# Patient Record
Sex: Female | Born: 1945 | Hispanic: No | Marital: Single | Smoking: Former smoker
Health system: Southern US, Community
[De-identification: ages and names within clinical notes are randomized; demographics above are authoritative.]

## PROBLEM LIST (undated history)

## (undated) DIAGNOSIS — I509 Heart failure, unspecified: Secondary | ICD-10-CM

## (undated) DIAGNOSIS — N183 Chronic kidney disease, stage 3 unspecified: Secondary | ICD-10-CM

## (undated) DIAGNOSIS — E039 Hypothyroidism, unspecified: Secondary | ICD-10-CM

## (undated) DIAGNOSIS — K219 Gastro-esophageal reflux disease without esophagitis: Secondary | ICD-10-CM

## (undated) DIAGNOSIS — I2699 Other pulmonary embolism without acute cor pulmonale: Secondary | ICD-10-CM

## (undated) DIAGNOSIS — C159 Malignant neoplasm of esophagus, unspecified: Secondary | ICD-10-CM

## (undated) DIAGNOSIS — I2510 Atherosclerotic heart disease of native coronary artery without angina pectoris: Secondary | ICD-10-CM

## (undated) DIAGNOSIS — I10 Essential (primary) hypertension: Secondary | ICD-10-CM

## (undated) DIAGNOSIS — J449 Chronic obstructive pulmonary disease, unspecified: Secondary | ICD-10-CM

## (undated) DIAGNOSIS — E785 Hyperlipidemia, unspecified: Secondary | ICD-10-CM

## (undated) DIAGNOSIS — M1990 Unspecified osteoarthritis, unspecified site: Secondary | ICD-10-CM

## (undated) DIAGNOSIS — I4891 Unspecified atrial fibrillation: Secondary | ICD-10-CM

## (undated) HISTORY — PX: EYE SURGERY: SHX253

## (undated) HISTORY — PX: UPPER GI ENDOSCOPY: SHX6162

## (undated) HISTORY — PX: OTHER SURGICAL HISTORY: SHX169

## (undated) HISTORY — PX: CORONARY ARTERY BYPASS GRAFT: SHX141

---

## 2015-09-14 ENCOUNTER — Inpatient Hospital Stay (HOSPITAL_COMMUNITY)
Admission: AD | Admit: 2015-09-14 | Discharge: 2015-09-21 | DRG: 871 | Disposition: A | Discharge status: Home / Self Care | Payer: Medicare (Managed Care) | Source: Other Acute Inpatient Hospital | Attending: Internal Medicine | Admitting: Internal Medicine

## 2015-09-14 ENCOUNTER — Encounter (HOSPITAL_COMMUNITY): Payer: Self-pay | Admitting: Internal Medicine

## 2015-09-14 DIAGNOSIS — I509 Heart failure, unspecified: Secondary | ICD-10-CM | POA: Diagnosis not present

## 2015-09-14 DIAGNOSIS — C159 Malignant neoplasm of esophagus, unspecified: Secondary | ICD-10-CM | POA: Diagnosis present

## 2015-09-14 DIAGNOSIS — Z7982 Long term (current) use of aspirin: Secondary | ICD-10-CM | POA: Diagnosis not present

## 2015-09-14 DIAGNOSIS — Z86711 Personal history of pulmonary embolism: Secondary | ICD-10-CM

## 2015-09-14 DIAGNOSIS — J189 Pneumonia, unspecified organism: Secondary | ICD-10-CM | POA: Diagnosis present

## 2015-09-14 DIAGNOSIS — I25810 Atherosclerosis of coronary artery bypass graft(s) without angina pectoris: Secondary | ICD-10-CM | POA: Diagnosis present

## 2015-09-14 DIAGNOSIS — K219 Gastro-esophageal reflux disease without esophagitis: Secondary | ICD-10-CM | POA: Diagnosis present

## 2015-09-14 DIAGNOSIS — Z8249 Family history of ischemic heart disease and other diseases of the circulatory system: Secondary | ICD-10-CM | POA: Diagnosis not present

## 2015-09-14 DIAGNOSIS — J9602 Acute respiratory failure with hypercapnia: Secondary | ICD-10-CM

## 2015-09-14 DIAGNOSIS — Z87891 Personal history of nicotine dependence: Secondary | ICD-10-CM | POA: Diagnosis not present

## 2015-09-14 DIAGNOSIS — J9601 Acute respiratory failure with hypoxia: Secondary | ICD-10-CM

## 2015-09-14 DIAGNOSIS — I5033 Acute on chronic diastolic (congestive) heart failure: Secondary | ICD-10-CM | POA: Diagnosis not present

## 2015-09-14 DIAGNOSIS — J9600 Acute respiratory failure, unspecified whether with hypoxia or hypercapnia: Secondary | ICD-10-CM | POA: Diagnosis present

## 2015-09-14 DIAGNOSIS — J441 Chronic obstructive pulmonary disease with (acute) exacerbation: Secondary | ICD-10-CM | POA: Diagnosis present

## 2015-09-14 DIAGNOSIS — E785 Hyperlipidemia, unspecified: Secondary | ICD-10-CM | POA: Diagnosis present

## 2015-09-14 DIAGNOSIS — I2510 Atherosclerotic heart disease of native coronary artery without angina pectoris: Secondary | ICD-10-CM | POA: Diagnosis present

## 2015-09-14 DIAGNOSIS — N183 Chronic kidney disease, stage 3 unspecified: Secondary | ICD-10-CM | POA: Diagnosis present

## 2015-09-14 DIAGNOSIS — I5022 Chronic systolic (congestive) heart failure: Secondary | ICD-10-CM | POA: Diagnosis present

## 2015-09-14 DIAGNOSIS — Z955 Presence of coronary angioplasty implant and graft: Secondary | ICD-10-CM

## 2015-09-14 DIAGNOSIS — Z888 Allergy status to other drugs, medicaments and biological substances status: Secondary | ICD-10-CM

## 2015-09-14 DIAGNOSIS — R0602 Shortness of breath: Secondary | ICD-10-CM | POA: Diagnosis present

## 2015-09-14 DIAGNOSIS — Y95 Nosocomial condition: Secondary | ICD-10-CM | POA: Diagnosis present

## 2015-09-14 DIAGNOSIS — I4891 Unspecified atrial fibrillation: Secondary | ICD-10-CM | POA: Diagnosis present

## 2015-09-14 DIAGNOSIS — I272 Other secondary pulmonary hypertension: Secondary | ICD-10-CM | POA: Diagnosis present

## 2015-09-14 DIAGNOSIS — N179 Acute kidney failure, unspecified: Secondary | ICD-10-CM | POA: Diagnosis present

## 2015-09-14 DIAGNOSIS — Z9981 Dependence on supplemental oxygen: Secondary | ICD-10-CM

## 2015-09-14 DIAGNOSIS — I10 Essential (primary) hypertension: Secondary | ICD-10-CM | POA: Diagnosis present

## 2015-09-14 DIAGNOSIS — N289 Disorder of kidney and ureter, unspecified: Secondary | ICD-10-CM | POA: Diagnosis not present

## 2015-09-14 DIAGNOSIS — Z79891 Long term (current) use of opiate analgesic: Secondary | ICD-10-CM

## 2015-09-14 DIAGNOSIS — I482 Chronic atrial fibrillation, unspecified: Secondary | ICD-10-CM | POA: Diagnosis present

## 2015-09-14 DIAGNOSIS — J9621 Acute and chronic respiratory failure with hypoxia: Secondary | ICD-10-CM | POA: Diagnosis present

## 2015-09-14 DIAGNOSIS — A419 Sepsis, unspecified organism: Secondary | ICD-10-CM | POA: Diagnosis present

## 2015-09-14 DIAGNOSIS — E039 Hypothyroidism, unspecified: Secondary | ICD-10-CM | POA: Diagnosis present

## 2015-09-14 DIAGNOSIS — F419 Anxiety disorder, unspecified: Secondary | ICD-10-CM | POA: Diagnosis present

## 2015-09-14 DIAGNOSIS — Z79899 Other long term (current) drug therapy: Secondary | ICD-10-CM

## 2015-09-14 DIAGNOSIS — I481 Persistent atrial fibrillation: Secondary | ICD-10-CM | POA: Diagnosis not present

## 2015-09-14 DIAGNOSIS — Z8501 Personal history of malignant neoplasm of esophagus: Secondary | ICD-10-CM

## 2015-09-14 DIAGNOSIS — J9620 Acute and chronic respiratory failure, unspecified whether with hypoxia or hypercapnia: Secondary | ICD-10-CM | POA: Diagnosis present

## 2015-09-14 DIAGNOSIS — Z951 Presence of aortocoronary bypass graft: Secondary | ICD-10-CM | POA: Diagnosis present

## 2015-09-14 DIAGNOSIS — G4733 Obstructive sleep apnea (adult) (pediatric): Secondary | ICD-10-CM | POA: Diagnosis present

## 2015-09-14 DIAGNOSIS — J9622 Acute and chronic respiratory failure with hypercapnia: Secondary | ICD-10-CM | POA: Diagnosis present

## 2015-09-14 HISTORY — DX: Hypothyroidism, unspecified: E03.9

## 2015-09-14 HISTORY — DX: Heart failure, unspecified: I50.9

## 2015-09-14 HISTORY — DX: Gastro-esophageal reflux disease without esophagitis: K21.9

## 2015-09-14 HISTORY — DX: Atherosclerotic heart disease of native coronary artery without angina pectoris: I25.10

## 2015-09-14 HISTORY — DX: Other pulmonary embolism without acute cor pulmonale: I26.99

## 2015-09-14 HISTORY — DX: Chronic kidney disease, stage 3 (moderate): N18.3

## 2015-09-14 HISTORY — DX: Chronic kidney disease, stage 3 unspecified: N18.30

## 2015-09-14 HISTORY — DX: Hyperlipidemia, unspecified: E78.5

## 2015-09-14 HISTORY — DX: Unspecified osteoarthritis, unspecified site: M19.90

## 2015-09-14 HISTORY — DX: Unspecified atrial fibrillation: I48.91

## 2015-09-14 HISTORY — DX: Essential (primary) hypertension: I10

## 2015-09-14 HISTORY — DX: Malignant neoplasm of esophagus, unspecified: C15.9

## 2015-09-14 HISTORY — DX: Chronic obstructive pulmonary disease, unspecified: J44.9

## 2015-09-14 LAB — TROPONIN I: TROPONIN I: 0.04 ng/mL — AB (ref <0.031)

## 2015-09-14 LAB — MRSA PCR SCREENING: MRSA by PCR: NEGATIVE

## 2015-09-14 LAB — BLOOD GAS, ARTERIAL
Acid-Base Excess: 7.4 mmol/L — ABNORMAL HIGH (ref 0.0–2.0)
BICARBONATE: 32.3 meq/L — AB (ref 20.0–24.0)
Delivery systems: POSITIVE
Drawn by: 24487
Expiratory PAP: 6
FIO2: 0.4
Inspiratory PAP: 12
O2 SAT: 97.8 %
PATIENT TEMPERATURE: 97.9
PO2 ART: 106 mmHg — AB (ref 80.0–100.0)
TCO2: 33.9 mmol/L (ref 0–100)
pCO2 arterial: 52.7 mmHg — ABNORMAL HIGH (ref 35.0–45.0)
pH, Arterial: 7.403 (ref 7.350–7.450)

## 2015-09-14 MED ORDER — LORAZEPAM 2 MG/ML IJ SOLN
0.5000 mg | INTRAMUSCULAR | Status: DC | PRN
  Administered 2015-09-15: 0.5 mg via INTRAVENOUS
  Filled 2015-09-14: qty 1

## 2015-09-14 MED ORDER — IPRATROPIUM-ALBUTEROL 0.5-2.5 (3) MG/3ML IN SOLN
3.0000 mL | Freq: Four times a day (QID) | RESPIRATORY_TRACT | Status: DC
  Administered 2015-09-15 – 2015-09-16 (×6): 3 mL via RESPIRATORY_TRACT
  Filled 2015-09-14 (×6): qty 3

## 2015-09-14 MED ORDER — METHYLPREDNISOLONE SODIUM SUCC 125 MG IJ SOLR
60.0000 mg | Freq: Every day | INTRAMUSCULAR | Status: DC
  Administered 2015-09-15 (×2): 60 mg via INTRAVENOUS
  Filled 2015-09-14 (×3): qty 2

## 2015-09-14 MED ORDER — NITROGLYCERIN 0.4 MG SL SUBL
0.4000 mg | SUBLINGUAL_TABLET | SUBLINGUAL | Status: DC | PRN
  Administered 2015-09-17: 0.4 mg via SUBLINGUAL
  Filled 2015-09-14: qty 1

## 2015-09-14 MED ORDER — LEVOFLOXACIN IN D5W 500 MG/100ML IV SOLN
500.0000 mg | INTRAVENOUS | Status: DC
  Administered 2015-09-15 – 2015-09-16 (×2): 500 mg via INTRAVENOUS
  Filled 2015-09-14 (×3): qty 100

## 2015-09-14 MED ORDER — DABIGATRAN ETEXILATE MESYLATE 150 MG PO CAPS
150.0000 mg | ORAL_CAPSULE | Freq: Two times a day (BID) | ORAL | Status: DC
  Administered 2015-09-14 – 2015-09-21 (×14): 150 mg via ORAL
  Filled 2015-09-14 (×14): qty 1

## 2015-09-14 MED ORDER — VANCOMYCIN HCL IN DEXTROSE 1-5 GM/200ML-% IV SOLN
1000.0000 mg | INTRAVENOUS | Status: DC
  Administered 2015-09-15: 1000 mg via INTRAVENOUS
  Filled 2015-09-14 (×2): qty 200

## 2015-09-14 MED ORDER — SODIUM CHLORIDE 0.9 % IJ SOLN
3.0000 mL | Freq: Two times a day (BID) | INTRAMUSCULAR | Status: DC
  Administered 2015-09-15 – 2015-09-21 (×14): 3 mL via INTRAVENOUS

## 2015-09-14 MED ORDER — DILTIAZEM HCL 100 MG IV SOLR
5.0000 mg/h | INTRAVENOUS | Status: DC
  Administered 2015-09-15: 10 mg/h via INTRAVENOUS
  Filled 2015-09-14: qty 100

## 2015-09-14 NOTE — Progress Notes (Signed)
Repeat ABG with continued elevation in CO2 - continue BIPAP overnight, reassess in the morning.   Gilles Chiquito, MD

## 2015-09-14 NOTE — Progress Notes (Signed)
Pt arrived via stretcher from [facility], pt on Bi-pap on arrival, RT  placed pt on [facility] Bi-pap at 40%. Pt Alert and oriented x4, oriented to room and call bell and when to call for assist. HR 113-130's afib (was in 140's at [facility]). Contacted Dr Verlon Au via page with pt information of HR and BP, and need for admit orders, he returned call and stated "flow manager" with [facility] would be admitting her, inquired of need for immediate orders, none needed at this time as pt on Bi-pap and tolerating, sats 100%, HR fluctuating mostly in 110's and 120's at time of call. Await MD to place admiting orders.

## 2015-09-14 NOTE — H&P (Addendum)
[facility] History and Physical  Lindsey Mejia OYD:[id number] DOB: 14-Sep-1946 DOA: 09/14/2015  Referring physician: ED physician PCP: Azzie Almas, MD   Chief Complaint: difficulty breathing  HPI:  [name] is a 69yo woman with PMH of COPD on oxygen at night, 2L, Afib with RVR on dabigitran, CAD s/p CABG in 1997 and PCI in 2013, PE in 2013, HTN, Hypothyroidism, GERD who presented to [facility] with acute respiratory failure with O2 saturation int he 60s.  She was started initially on NRB and then on Bipap.  Patient wearing BIPAP mask when seen, appeared fatigued but alert. She was able to answer some questions, no family present.  Most of history was obtained by chart review from ED records.    Per review of records, patient arrived by EMS.  Found to be tachycardic and hypoxic.  She was initially started on NRB and quickly transitioned to BIPAP.  She had a CXR which showed apparent concern for LLL PNA and pulmonary congestion.  She had a very elevated BNP, 11,500.  She was initially given IV lasix and nitropatch, however, her blood pressure could not tolerate the nitropatch and this was discontinued.  BC were ordered, sputum culture unable to be obtained.  She was given Levaquin and Vancomycin in the ED along with solumedrol 125mg .  Foley was placed.  She was given metoprolol 5mg  IV for her elevated HR.  Patient's daughter was called and confirmed Full Code per records.    ABG on BIPAP showed 7.34/63/119, Lactate was 3.5, trended down to 1.7.  Antibiotics were chosen given recent admission to [facility] in August of 2016 (2 months).  CBC was 19, Cr 1.2.  TnI initially negative.     Assessment and Plan: Acute on chronic respiratory failure - combined picture with likely COPD exacerbation, pneumonia (HCAP?) and some CHF exacerbation - Continue BIPAP - Repeat ABG - Repeat labs including BMET, CBC - Trend troponin as this was rising - EKG (none found in records) - Hold on diuretics  for now given BP and need to start rate controlling medications; she does not seems excessively overloaded on exam - If EKG returns with concerning for ACS, start heparin - For COPD component, will start solumedrol 60mg  daily, duonebs if she can tolerate them - For PNA component, will start Levaquin and Vancomycin for HCAP - For CHF component, holding on diuretics, control HR, consider TTE in the AM - H/O PE - will continue dabigatran - Trial off Bipap if she tolerate once we see what her ABG shows - Repeat lactate - BC X 2, sputum culture, urine culture - Repeat CXR in the AM - NPO x meds for now - Ativan for anxiety (she is on chronic valium)    COPD exacerbation (HCC) - As noted above, start solumedrol and breathing treatments - Continue BIPAP, repeat ABG  A-fib with RVR - HR sustaining int he 120s - 130s - EKG as noted - Telemetry - Cardizem drip started - Consider Heparin drip as noted above if she cannot tolerate pills or if she has changes on EKG.     HTN (hypertension) - Currently most oral medications are on hold.  - Will monitor closely for need to restart medications including IMDUR - Cardizem gtt   Hypothyroidism - Restart levothyroxine when she improves    CAD (coronary artery disease) of artery bypass graft - No chest pain reported, however, she has a slight bump in troponin - Trend troponin    CHF (congestive heart  failure) - unknown type, listed as diastolic in [facility] notes - Consider repeat TTE - Daily weights - Strict I/O   H/O Esophageal cancer ([place]) - Unclear history, will need to get more details from family or patient as they are able.     AKI - Mild, holding fluids at this time given normotensive and h/o HF and concern for pulmonary edema on CXR - Repeat CXR in the AM - Start fluids if BP lowers    Radiological Exams on Admission: CXR results as noted above, by report from outside hospital  Code Status: Full Family Communication: Pt at  bedside Disposition Plan: Admit for further evaluation    Gilles Chiquito, MD [phone]   Review of Systems unable to be completely obtained due to patient on BIPAP and severe fatigue:  Constitutional: + for fatigue. Negative for fever, chills.   HENT: Negative for hearing loss, ear pain  Respiratory: + for SOB  Cardiovascular: + for palpitations, negative for increased swelling Gastrointestinal: Negative for nausea, vomiting and abdominal pain Genitourinary: Negative for dysuria Musculoskeletal: Negative for myalgias, back pain Skin: Negative for itching and rash.  Neurological: Negative for dizziness and weakness.    Past Medical History  Diagnosis Date  . COPD (chronic obstructive pulmonary disease) ([place])     On home O2  . Pulmonary embolism ([place])   . HTN (hypertension)   . Hypothyroidism   . GERD (gastroesophageal reflux disease)   . HLD (hyperlipidemia)   . CAD (coronary artery disease)   . CHF (congestive heart failure) (HCC)     Unknown classification  . Arthritis   . Esophageal cancer ([place])   . Atrial fibrillation ([place])   . CKD (chronic kidney disease) stage 3, GFR 30-59 ml/min     Past Surgical History  Procedure Laterality Date  . Cardiac stents      DES to SVG  . Eye surgery      bilateral cataracts  . Upper gi endoscopy    . Coronary artery bypass graft      Social History:  reports that she quit smoking about 9 years ago. She does not have any smokeless tobacco history on file. She reports that she does not drink alcohol or use illicit drugs.  Allergies  Allergen Reactions  . Propoxyphene Nausea And Vomiting    Family History  Problem Relation Age of Onset  . Arthritis Mother   . Cancer Sister   . Heart disease Brother   . Liver disease Brother     Prior to Admission medications   Aspirin 81mg  Qdaily Atorvastatin 40mg  Qdaily Beclomethasone INH 1 puff BID Cetirizine 10mg  Qdaily Cholecalciferol  1000 Units Qdaily Dabigatran 150mg  BID Valium  10mg  Qpm PRN anxiety Diltiazem 360mg  24 hour capsule Qdaily Famotidine 20mg  BID Furosemide 80mg  QDaily IMDUR 60mg  Qdaily Levothyroxine 51mcg Qdaily Magnesium Oxide 400mg  Qdaily Metoprolol succinate 200mg  in the AM and 100mg  in the PM Nitroglycerin SL PRN Oxycodone-APAP 10-325  Take 1 tab q 6 hours prn pain Tiotropium  67mcg INH daily    Physical Exam: Filed Vitals:   09/14/15 1900 09/14/15 1930 09/14/15 2000 09/14/15 2100  BP: 124/70 136/79 144/80 126/78  Pulse: 115 124 123 122  Temp:  97.7 F (36.5 C)    TempSrc:  Axillary    Resp: 17 17 18 17   Height:      Weight:      SpO2: 99% 100% 98% 100%    Physical Exam  Constitutional: Ill appearing woman, on BIPAP, fatigued, attempting to  answer questions HENT: Normocephalic. BIPAP mask in place Eyes: Conjunctivae  normal. no scleral icterus.  Neck: Normal ROM. Neck supple. JVD unable to be assessed due to BIPAP mask CVS: Irreg Irreg, tachycardic, S1/S2 +, no murmurs noted Pulmonary: Effort normal, Breath sounds very diminished, rales at bases, no active wheezing Abdominal: Soft. BS +,  no distension, tenderness Musculoskeletal: No edema and no tenderness.  Lymphadenopathy: No lymphadenopathy noted, cervical, inguinal. Neuro: Follows commands, moving all extremities Skin: Skin is somewhat cold and diaphoretic, she has extensive bruising to arms bilaterally, + bruising and scrapes on legs which she states are from the dogs.  No rash noted. No erythema. No pallor.  Psychiatric: Fatigued, answering questions appropriately.    Labs on from OSH records:  Mag 2.2 TnI < 0.03, repeat 0.059 Pro BNP 11,700  Na 139 K 4.5 Cl 91 CO2 35 BUN 34 Cr 1.2 (baseline 0.7-0.8) AST 105 ALT 166 ALP 122  WBC 19  UA Clear    EKG: not available, repeat   If 7PM-7AM, please contact night-coverage [email or web address] Password [email or web address] 09/14/2015, 10:52 PM

## 2015-09-14 NOTE — Progress Notes (Addendum)
ANTIBIOTIC CONSULT NOTE - INITIAL  Pharmacy Consult for levofloxacin Indication: pneumonia  Allergies not on file  Patient Measurements: Height: 5' (152.4 cm) Weight: 124 lb 12.5 oz (56.6 kg) IBW/kg (Calculated) : 45.5   Vital Signs: Temp: 97.7 F (36.5 C) (10/14 1930) Temp Source: Axillary (10/14 1930) BP: 126/78 mmHg (10/14 2100) Pulse Rate: 122 (10/14 2100) Intake/Output from previous day:   Intake/Output from this shift:    Labs: No results for input(s): WBC, HGB, PLT, LABCREA, CREATININE in the last 72 hours. CrCl cannot be calculated (Patient has no serum creatinine result on file.). No results for input(s): VANCOTROUGH, VANCOPEAK, VANCORANDOM, GENTTROUGH, GENTPEAK, GENTRANDOM, TOBRATROUGH, TOBRAPEAK, TOBRARND, AMIKACINPEAK, AMIKACINTROU, AMIKACIN in the last 72 hours.   Microbiology: Recent Results (from the past 720 hour(s))  MRSA PCR Screening     Status: None   Collection Time: 09/14/15  6:28 PM  Result Value Ref Range Status   MRSA by PCR NEGATIVE NEGATIVE Final    Comment:        The GeneXpert MRSA Assay (FDA approved for NASAL specimens only), is one component of a comprehensive MRSA colonization surveillance program. It is not intended to diagnose MRSA infection nor to guide or monitor treatment for MRSA infections.     Assessment: 62 YOF transferred from [facility] on BiPAP. To start levofloxacin for CAP. Afebrile currently. Labs have not yet been drawn at [facility].  Labs from [facility] as follows K 4.5, Phos 5.5, Mag 2.2, Ca 9.2, albumin 4.5 SCr 1.2, est CrCl ~59m/min AST 105, ALT 166, alk phos 122, Tbili 0.9. proBNP 11700 No WBC found. Lactic acid decreased from 3.5 to 1.7 while in ED.  [facility].  Patient received levofloxacin 7561mIV x1 at 1209 today. Also received vancomycin 1g IV x1 at 1250.  Goal of Therapy:  eradication of  infection  Plan:  -levofloxacin 50071mV q24h starting 10/15 at noon -follow c/s, clinical progression, renal function -follow need to add vancomycin d/t recent admission- may possibly need to cover patient for HCAP as opposed to just CAP  Khilynn Borntreger D. Aren Pryde, PharmD, BCPS Clinical Pharmacist Pager: [phone]/14/2016 10:13 PM

## 2015-09-14 NOTE — Progress Notes (Signed)
ANTIBIOTIC CONSULT NOTE - INITIAL  Pharmacy Consult for Vancomycin  Indication: pneumonia  Allergies  Allergen Reactions  . Propoxyphene Nausea And Vomiting    Patient Measurements: Height: 5' (152.4 cm) Weight: 124 lb 12.5 oz (56.6 kg) IBW/kg (Calculated) : 45.5 Vital Signs: Temp: 97.7 F (36.5 C) (10/14 1930) Temp Source: Axillary (10/14 1930) BP: 126/78 mmHg (10/14 2100) Pulse Rate: 122 (10/14 2100)  Labs from outside hospital: Labs from [facility] as follows K 4.5, Phos 5.5, Mag 2.2, Ca 9.2, albumin 4.5 SCr 1.2, est CrCl ~62m/min AST 105, ALT 166, alk phos 122, Tbili 0.9. proBNP 11700 No WBC found. Lactic acid decreased from 3.5 to 1.7 while in ED  Microbiology: Recent Results (from the past 720 hour(s))  MRSA PCR Screening     Status: None   Collection Time: 09/14/15  6:28 PM  Result Value Ref Range Status   MRSA by PCR NEGATIVE NEGATIVE Final    Comment:        The GeneXpert MRSA Assay (FDA approved for NASAL specimens only), is one component of a comprehensive MRSA colonization surveillance program. It is not intended to diagnose MRSA infection nor to guide or monitor treatment for MRSA infections.     Medical History: Past Medical History  Diagnosis Date  . COPD (chronic obstructive pulmonary disease) ([place]     On home O2  . Pulmonary embolism ([place]   . HTN (hypertension)   . Hypothyroidism   . GERD (gastroesophageal reflux disease)   . HLD (hyperlipidemia)   . CAD (coronary artery disease)   . CHF (congestive heart failure) (HCC)     Unknown classification  . Arthritis   . Esophageal cancer ([place]   . Atrial fibrillation ([place]   . CKD (chronic kidney disease) stage 3, GFR 30-59 ml/min     Assessment: Already on Levaquin, adding vancomycin to 69y/o [place] [facility], received vancomycin 1000 mg earlier today.   Goal of Therapy:  Vancomycin trough level 15-20 mcg/ml  Plan:  -Vancomycin 1000 mg IV  q24h -Already on Levaquin  -Drug levels at steady state  LNarda Bonds10/14/2016,11:31 PM

## 2015-09-15 ENCOUNTER — Inpatient Hospital Stay (HOSPITAL_COMMUNITY): Payer: Medicare (Managed Care)

## 2015-09-15 ENCOUNTER — Encounter (HOSPITAL_COMMUNITY): Payer: Self-pay | Admitting: *Deleted

## 2015-09-15 DIAGNOSIS — I5033 Acute on chronic diastolic (congestive) heart failure: Secondary | ICD-10-CM

## 2015-09-15 LAB — CBC
HEMATOCRIT: 40.8 % (ref 36.0–46.0)
HEMOGLOBIN: 12.3 g/dL (ref 12.0–15.0)
MCH: 28.1 pg (ref 26.0–34.0)
MCHC: 30.1 g/dL (ref 30.0–36.0)
MCV: 93.4 fL (ref 78.0–100.0)
Platelets: 155 10*3/uL (ref 150–400)
RBC: 4.37 MIL/uL (ref 3.87–5.11)
RDW: 18 % — AB (ref 11.5–15.5)
WBC: 14.7 10*3/uL — ABNORMAL HIGH (ref 4.0–10.5)

## 2015-09-15 LAB — COMPREHENSIVE METABOLIC PANEL
ALBUMIN: 3 g/dL — AB (ref 3.5–5.0)
ALT: 92 U/L — ABNORMAL HIGH (ref 14–54)
ANION GAP: 11 (ref 5–15)
AST: 49 U/L — AB (ref 15–41)
Alkaline Phosphatase: 64 U/L (ref 38–126)
BILIRUBIN TOTAL: 1.2 mg/dL (ref 0.3–1.2)
BUN: 27 mg/dL — AB (ref 6–20)
CO2: 31 mmol/L (ref 22–32)
Calcium: 8.6 mg/dL — ABNORMAL LOW (ref 8.9–10.3)
Chloride: 91 mmol/L — ABNORMAL LOW (ref 101–111)
Creatinine, Ser: 1.07 mg/dL — ABNORMAL HIGH (ref 0.44–1.00)
GFR calc Af Amer: >60 mL/min (ref >60)
GFR calc non Af Amer: 52 mL/min — ABNORMAL LOW (ref >60)
GLUCOSE: 115 mg/dL — AB (ref 65–99)
POTASSIUM: 4.6 mmol/L (ref 3.5–5.1)
SODIUM: 133 mmol/L — AB (ref 135–145)
TOTAL PROTEIN: 6 g/dL — AB (ref 6.5–8.1)

## 2015-09-15 LAB — TROPONIN I
TROPONIN I: 0.04 ng/mL — AB (ref <0.031)
TROPONIN I: 0.16 ng/mL — AB (ref <0.031)

## 2015-09-15 LAB — LACTIC ACID, PLASMA
Lactic Acid, Venous: 1.9 mmol/L (ref 0.5–2.0)
Lactic Acid, Venous: 1.2 mmol/L (ref 0.5–2.0)

## 2015-09-15 LAB — BRAIN NATRIURETIC PEPTIDE: B NATRIURETIC PEPTIDE 5: 694 pg/mL — AB (ref 0.0–100.0)

## 2015-09-15 MED ORDER — OXYCODONE-ACETAMINOPHEN 5-325 MG PO TABS
1.0000 | ORAL_TABLET | ORAL | Status: DC | PRN
  Administered 2015-09-15 – 2015-09-21 (×14): 1 via ORAL
  Filled 2015-09-15 (×14): qty 1

## 2015-09-15 MED ORDER — DILTIAZEM HCL 25 MG/5ML IV SOLN
5.0000 mg | Freq: Once | INTRAVENOUS | Status: AC
  Administered 2015-09-15: 5 mg via INTRAVENOUS
  Filled 2015-09-15: qty 5

## 2015-09-15 MED ORDER — LEVOTHYROXINE SODIUM 50 MCG PO TABS
50.0000 ug | ORAL_TABLET | Freq: Every day | ORAL | Status: DC
  Administered 2015-09-15: 50 ug via ORAL
  Filled 2015-09-15: qty 1

## 2015-09-15 MED ORDER — CHLORHEXIDINE GLUCONATE 0.12 % MT SOLN
15.0000 mL | Freq: Two times a day (BID) | OROMUCOSAL | Status: DC
  Administered 2015-09-15 – 2015-09-21 (×9): 15 mL via OROMUCOSAL
  Filled 2015-09-15 (×10): qty 15

## 2015-09-15 MED ORDER — METOPROLOL TARTRATE 1 MG/ML IV SOLN
5.0000 mg | INTRAVENOUS | Status: DC | PRN
  Administered 2015-09-15: 5 mg via INTRAVENOUS
  Filled 2015-09-15: qty 5

## 2015-09-15 MED ORDER — ATORVASTATIN CALCIUM 40 MG PO TABS
40.0000 mg | ORAL_TABLET | Freq: Every day | ORAL | Status: DC
  Administered 2015-09-15: 40 mg via ORAL
  Filled 2015-09-15: qty 1

## 2015-09-15 MED ORDER — CETYLPYRIDINIUM CHLORIDE 0.05 % MT LIQD
7.0000 mL | Freq: Two times a day (BID) | OROMUCOSAL | Status: DC
  Administered 2015-09-15 – 2015-09-20 (×8): 7 mL via OROMUCOSAL

## 2015-09-15 MED ORDER — OXYCODONE HCL 5 MG PO TABS
5.0000 mg | ORAL_TABLET | ORAL | Status: DC | PRN
  Administered 2015-09-15 – 2015-09-21 (×11): 5 mg via ORAL
  Filled 2015-09-15 (×11): qty 1

## 2015-09-15 MED ORDER — LEVOTHYROXINE SODIUM 50 MCG PO TABS: 50.0000 ug | ORAL_TABLET | Freq: Every day | ORAL | Status: DC

## 2015-09-15 MED ORDER — INFLUENZA VAC SPLIT QUAD 0.5 ML IM SUSY
0.5000 mL | PREFILLED_SYRINGE | INTRAMUSCULAR | Status: AC
  Administered 2015-09-16: 0.5 mL via INTRAMUSCULAR
  Filled 2015-09-15: qty 0.5

## 2015-09-15 MED ORDER — DILTIAZEM HCL 30 MG PO TABS
30.0000 mg | ORAL_TABLET | Freq: Four times a day (QID) | ORAL | Status: DC
  Administered 2015-09-15 – 2015-09-16 (×4): 30 mg via ORAL
  Filled 2015-09-15 (×4): qty 1

## 2015-09-15 NOTE — Progress Notes (Signed)
Patient c/o back ache, takes percocet 10/325 at home for chronic pain. Also asking about her lipitor prescription. NP on call notified. Orders received.

## 2015-09-15 NOTE — Progress Notes (Signed)
Patient's HR has been slowly increasing now in 110-140s sustained. Patient not symptomatic.  Metoprolol 5mg  IV administered at 10pm w/o any improvement in HR. NP for [facility] notified, order for cardizem 5mg  IV push received. Will administer when available from pharmacy.

## 2015-09-15 NOTE — Progress Notes (Signed)
[place] TEAM 1 - Stepdown/ICU TEAM PROGRESS NOTE  Glenora Morocho IPJ:[id number] DOB: 06/10/1946 DOA: 09/14/2015 PCP: Azzie Almas, MD  Admit HPI / Brief Narrative: 69yo F with Hx of COPD on 2L oxygen at night, Afib with RVR on dabigitran, CAD s/p CABG in 1997 and PCI in 2013, PE in 2013, HTN, Hypothyroidism, and GERD who presented to [facility] with acute respiratory failure with O2 saturations in the 50s. She was started initially on NRB and then on Bipap.  CXR showed apparent LLL infiltrate. She had a BNP of 11,500. She was initially given IV lasix and nitropatch, however, her blood pressure could not tolerate the nitropatch and this was discontinued. BC were ordered, sputum culture unable to be obtained. She was given Levaquin and Vancomycin in the ED along with solumedrol 125mg . Foley was placed. She was given metoprolol 5mg  IV for her elevated HR.   ABG on BIPAP showed 7.34/63/119, Lactate was 3.5, trended down to 1.7. Antibiotics were chosen given recent admission to [facility] in August of 2016 (2 months). CBC was 19, Cr 1.2. TnI initially negative.   HPI/Subjective: The patient states she is feeling much better though not yet back to her baseline.  She remains somewhat short of breath.  She denies chest pain nausea vomiting or abdominal pain.  She does complain of some mid left sided back pain.  Assessment/Plan:  Acute on chronic hypoxic and hypercarbic respiratory failure - multifactorial (see below) Weaned from BiPAP to Venturi mask - sats 95-97% on 0.30  COPD exacerbation Appears to be improving with usual treatment - follow trend - patient confirms she has not smoked in years  Sepsis due to LLL HCAP WBC 14.7 + HR 115 - cont empiric abx coverage - hemodynamically stabilizing   ?Acute CHF exacerbation No information available to qualify or quantify - obtain TTE - BNP here only modestly elevated - no clinical evidence of signif volume overload - hold diuretics and  gently hydrate    Chronic A-fib with acute RVR Rate now well controlled/borderline bradycardic - follow on tele - CHA2DS2 - VASc 4+ - cont Pradaxa  CKD stage 3 crt appears to be at or better than reported baseline -   Hx of PE  Cont pradaxa  HTN BP marginal/low presently - gently hydrate and follow   Hypothyroidism Check TSH   CAD s/p CABG Denies cp - trop not significantly elevated - no EKGs in Epic   Hx esophageal CA  Code Status: FULL Family Communication: no family present at time of exam Disposition Plan: SDU  Consultants: none  Procedures: none  Antibiotics: Levaquin 10/14 > Vanc 10/14 >  DVT prophylaxis: pradaxa  Objective: Blood pressure 120/53, pulse 42, temperature 97.8 F (36.6 C), temperature source Oral, resp. rate 18, height 5' (1.524 m), weight 55.8 kg (123 lb 0.3 oz), SpO2 97 %.  Intake/Output Summary (Last 24 hours) at 09/15/15 7341 Last data filed at 09/15/15 0800  Gross per 24 hour  Intake 176.92 ml  Output    600 ml  Net -423.08 ml   Exam: General: only mild resp distress on venturi mask  Lungs: mild diffuse expiratory wheezes - bibasilar crackles left >right  Cardiovascular: Regular rate and rhythm without murmur gallop or rub normal S1 and S2 - no JVD Abdomen: Nontender, nondistended, soft, bowel sounds positive, no rebound, no ascites, no appreciable mass Extremities: No significant cyanosis, clubbing, or edema bilateral lower extremities  Data Reviewed: Basic Metabolic Panel:  Recent Labs Lab 09/15/15 0345  NA 133*  K 4.6  CL 91*  CO2 31  GLUCOSE 115*  BUN 27*  CREATININE 1.07*  CALCIUM 8.6*    CBC:  Recent Labs Lab 09/15/15 0345  WBC 14.7*  HGB 12.3  HCT 40.8  MCV 93.4  PLT 155    Liver Function Tests:  Recent Labs Lab 09/15/15 0345  AST 49*  ALT 92*  ALKPHOS 64  BILITOT 1.2  PROT 6.0*  ALBUMIN 3.0*   Cardiac Enzymes:  Recent Labs Lab 09/14/15 2256 09/15/15 0345  TROPONINI 0.04* 0.04*     Recent Results (from the past 240 hour(s))  MRSA PCR Screening     Status: None   Collection Time: 09/14/15  6:28 PM  Result Value Ref Range Status   MRSA by PCR NEGATIVE NEGATIVE Final    Comment:        The GeneXpert MRSA Assay (FDA approved for NASAL specimens only), is one component of a comprehensive MRSA colonization surveillance program. It is not intended to diagnose MRSA infection nor to guide or monitor treatment for MRSA infections.      Studies:   Recent x-ray studies have been reviewed in detail by the Attending Physician  Scheduled Meds:  Scheduled Meds: . antiseptic oral rinse  7 mL Mouth Rinse q12n4p  . chlorhexidine  15 mL Mouth Rinse BID  . dabigatran  150 mg Oral Q12H  . [START ON 09/16/2015] Influenza vac split quadrivalent PF  0.5 mL Intramuscular Tomorrow-1000  . ipratropium-albuterol  3 mL Nebulization Q6H  . levofloxacin (LEVAQUIN) IV  500 mg Intravenous Q24H  . methylPREDNISolone (SOLU-MEDROL) injection  60 mg Intravenous Daily  . sodium chloride  3 mL Intravenous Q12H  . vancomycin  1,000 mg Intravenous Q24H    Time spent on care of this patient: 35 mins   Kambra Beachem T , MD   [facility] Office  [phone] Pager - Text Page per Shea Evans as per below:  On-Call/Text Page:      [email or web address]      password [email or web address]  If 7PM-7AM, please contact night-coverage [email or web address] Password [email or web address] 09/15/2015, 9:21 AM   LOS: 1 day

## 2015-09-16 ENCOUNTER — Inpatient Hospital Stay (HOSPITAL_COMMUNITY): Payer: Medicare (Managed Care)

## 2015-09-16 DIAGNOSIS — A419 Sepsis, unspecified organism: Secondary | ICD-10-CM | POA: Diagnosis not present

## 2015-09-16 LAB — CBC
HCT: 39.2 % (ref 36.0–46.0)
Hemoglobin: 12 g/dL (ref 12.0–15.0)
MCH: 28.2 pg (ref 26.0–34.0)
MCHC: 30.6 g/dL (ref 30.0–36.0)
MCV: 92 fL (ref 78.0–100.0)
Platelets: 193 K/uL (ref 150–400)
RBC: 4.26 MIL/uL (ref 3.87–5.11)
RDW: 18.1 % — ABNORMAL HIGH (ref 11.5–15.5)
WBC: 13.3 K/uL — ABNORMAL HIGH (ref 4.0–10.5)

## 2015-09-16 LAB — COMPREHENSIVE METABOLIC PANEL WITH GFR
ALT: 66 U/L — ABNORMAL HIGH (ref 14–54)
AST: 31 U/L (ref 15–41)
Albumin: 2.9 g/dL — ABNORMAL LOW (ref 3.5–5.0)
Alkaline Phosphatase: 63 U/L (ref 38–126)
Anion gap: 10 (ref 5–15)
BUN: 31 mg/dL — ABNORMAL HIGH (ref 6–20)
CO2: 34 mmol/L — ABNORMAL HIGH (ref 22–32)
Calcium: 8.7 mg/dL — ABNORMAL LOW (ref 8.9–10.3)
Chloride: 90 mmol/L — ABNORMAL LOW (ref 101–111)
Creatinine, Ser: 1.07 mg/dL — ABNORMAL HIGH (ref 0.44–1.00)
GFR calc Af Amer: >60 mL/min
GFR calc non Af Amer: 52 mL/min — ABNORMAL LOW
Glucose, Bld: 168 mg/dL — ABNORMAL HIGH (ref 65–99)
Potassium: 3.9 mmol/L (ref 3.5–5.1)
Sodium: 134 mmol/L — ABNORMAL LOW (ref 135–145)
Total Bilirubin: 0.4 mg/dL (ref 0.3–1.2)
Total Protein: 5.7 g/dL — ABNORMAL LOW (ref 6.5–8.1)

## 2015-09-16 LAB — URINE CULTURE: Culture: NO GROWTH

## 2015-09-16 LAB — TROPONIN I: TROPONIN I: 0.05 ng/mL — AB (ref <0.031)

## 2015-09-16 MED ORDER — METOPROLOL SUCCINATE ER 100 MG PO TB24: 100.0000 mg | ORAL_TABLET | Freq: Two times a day (BID) | ORAL | Status: DC

## 2015-09-16 MED ORDER — METOPROLOL SUCCINATE ER 100 MG PO TB24
200.0000 mg | ORAL_TABLET | Freq: Every day | ORAL | Status: DC
  Administered 2015-09-16 – 2015-09-21 (×6): 200 mg via ORAL
  Filled 2015-09-16 (×6): qty 2

## 2015-09-16 MED ORDER — LEVALBUTEROL HCL 0.63 MG/3ML IN NEBU
0.6300 mg | INHALATION_SOLUTION | Freq: Four times a day (QID) | RESPIRATORY_TRACT | Status: DC
Start: 2015-09-16 — End: 2015-09-20
  Administered 2015-09-16 – 2015-09-20 (×17): 0.63 mg via RESPIRATORY_TRACT
  Filled 2015-09-16 (×17): qty 3

## 2015-09-16 MED ORDER — PREDNISONE 20 MG PO TABS
40.0000 mg | ORAL_TABLET | Freq: Every day | ORAL | Status: AC
  Administered 2015-09-16 – 2015-09-17 (×2): 40 mg via ORAL
  Filled 2015-09-16 (×2): qty 2

## 2015-09-16 MED ORDER — LEVOTHYROXINE SODIUM 50 MCG PO TABS
50.0000 ug | ORAL_TABLET | Freq: Every day | ORAL | Status: DC
  Administered 2015-09-16 – 2015-09-21 (×6): 50 ug via ORAL
  Filled 2015-09-16 (×7): qty 1

## 2015-09-16 MED ORDER — DILTIAZEM HCL 60 MG PO TABS
60.0000 mg | ORAL_TABLET | Freq: Four times a day (QID) | ORAL | Status: DC
Start: 2015-09-16 — End: 2015-09-16

## 2015-09-16 MED ORDER — FUROSEMIDE 40 MG PO TABS
40.0000 mg | ORAL_TABLET | Freq: Every day | ORAL | Status: DC
  Administered 2015-09-16 – 2015-09-21 (×6): 40 mg via ORAL
  Filled 2015-09-16 (×6): qty 1

## 2015-09-16 MED ORDER — DIAZEPAM 5 MG PO TABS
10.0000 mg | ORAL_TABLET | Freq: Four times a day (QID) | ORAL | Status: DC | PRN
  Administered 2015-09-17: 10 mg via ORAL
  Filled 2015-09-16: qty 2

## 2015-09-16 MED ORDER — ATORVASTATIN CALCIUM 40 MG PO TABS: 40.0000 mg | ORAL_TABLET | Freq: Every day | ORAL | Status: DC

## 2015-09-16 MED ORDER — DABIGATRAN ETEXILATE MESYLATE 150 MG PO CAPS: 150.0000 mg | ORAL_CAPSULE | Freq: Two times a day (BID) | ORAL | Status: DC

## 2015-09-16 MED ORDER — LEVOTHYROXINE SODIUM 50 MCG PO TABS: 50.0000 ug | ORAL_TABLET | Freq: Every day | ORAL | Status: DC

## 2015-09-16 MED ORDER — IPRATROPIUM-ALBUTEROL 0.5-2.5 (3) MG/3ML IN SOLN: 3.0000 mL | Freq: Four times a day (QID) | RESPIRATORY_TRACT | Status: DC

## 2015-09-16 MED ORDER — DILTIAZEM HCL ER BEADS 120 MG PO CP24
360.0000 mg | ORAL_CAPSULE | Freq: Every day | ORAL | Status: DC
  Filled 2015-09-16: qty 1

## 2015-09-16 MED ORDER — ISOSORBIDE MONONITRATE ER 60 MG PO TB24
60.0000 mg | ORAL_TABLET | Freq: Every day | ORAL | Status: DC
  Administered 2015-09-16 – 2015-09-21 (×6): 60 mg via ORAL
  Filled 2015-09-16 (×6): qty 1

## 2015-09-16 MED ORDER — TIOTROPIUM BROMIDE MONOHYDRATE 18 MCG IN CAPS
18.0000 ug | ORAL_CAPSULE | Freq: Every day | RESPIRATORY_TRACT | Status: DC
  Filled 2015-09-16: qty 5

## 2015-09-16 MED ORDER — FAMOTIDINE 20 MG PO TABS
20.0000 mg | ORAL_TABLET | Freq: Every day | ORAL | Status: DC
  Administered 2015-09-16 – 2015-09-21 (×6): 20 mg via ORAL
  Filled 2015-09-16 (×6): qty 1

## 2015-09-16 MED ORDER — BUDESONIDE 0.25 MG/2ML IN SUSP
0.2500 mg | Freq: Two times a day (BID) | RESPIRATORY_TRACT | Status: DC
  Administered 2015-09-16 – 2015-09-21 (×11): 0.25 mg via RESPIRATORY_TRACT
  Filled 2015-09-16 (×11): qty 2

## 2015-09-16 MED ORDER — ASPIRIN 81 MG PO CHEW
81.0000 mg | CHEWABLE_TABLET | Freq: Every day | ORAL | Status: DC
  Administered 2015-09-16 – 2015-09-21 (×6): 81 mg via ORAL
  Filled 2015-09-16 (×6): qty 1

## 2015-09-16 MED ORDER — DILTIAZEM HCL 60 MG PO TABS
90.0000 mg | ORAL_TABLET | Freq: Four times a day (QID) | ORAL | Status: AC
  Administered 2015-09-16 (×2): 90 mg via ORAL
  Filled 2015-09-16 (×4): qty 1

## 2015-09-16 MED ORDER — DILTIAZEM HCL ER BEADS 240 MG PO CP24
360.0000 mg | ORAL_CAPSULE | Freq: Every day | ORAL | Status: DC
  Filled 2015-09-16: qty 1

## 2015-09-16 MED ORDER — IPRATROPIUM BROMIDE 0.02 % IN SOLN
0.5000 mg | Freq: Four times a day (QID) | RESPIRATORY_TRACT | Status: DC
  Administered 2015-09-16 – 2015-09-20 (×17): 0.5 mg via RESPIRATORY_TRACT
  Filled 2015-09-16 (×17): qty 2.5

## 2015-09-16 MED ORDER — METOPROLOL SUCCINATE ER 100 MG PO TB24
100.0000 mg | ORAL_TABLET | Freq: Every day | ORAL | Status: DC
  Administered 2015-09-16 – 2015-09-17 (×2): 100 mg via ORAL
  Filled 2015-09-16 (×2): qty 1

## 2015-09-16 NOTE — Progress Notes (Signed)
[place] TEAM 1 - Stepdown/ICU TEAM PROGRESS NOTE  Lindsey Mejia CHE:[id number] DOB: 1946-09-06 DOA: 09/14/2015 PCP: Azzie Almas, MD  Admit HPI / Brief Narrative: 69yo F with Hx of COPD on 2L oxygen at night, Afib with RVR on dabigitran, CAD s/p CABG in 1997 and PCI in 2013, PE in 2013, HTN, Hypothyroidism, and GERD who presented to [facility] with acute respiratory failure with O2 saturations in the 50s. She was started initially on NRB and then on Bipap.  CXR showed apparent LLL infiltrate. She had a BNP of 11,500. She was initially given IV lasix and nitropatch, however, her blood pressure could not tolerate the nitropatch and this was discontinued. BC were ordered, sputum culture unable to be obtained. She was given Levaquin and Vancomycin in the ED along with solumedrol 125mg . Foley was placed. She was given metoprolol 5mg  IV for her elevated HR.   ABG on BIPAP showed 7.34/63/119, Lactate was 3.5, trended down to 1.7. Antibiotics were chosen given recent admission to [facility] in August of 2016 (2 months). CBC was 19, Cr 1.2. TnI initially negative.   HPI/Subjective: Pt states she is less sob today.  She reports some modest nausea w/ meals, but denies cp, vomiting, or abdom pain.  She is anxious to be d/c home.    Assessment/Plan:  Acute on chronic hypoxic and hypercarbic respiratory failure - multifactorial (see below) Weaned from BiPAP to Venturi mask and now to [place] requiring only 2L at this time - follow sats w/ ambulation - attempt to wean to RA today   COPD exacerbation improving with usual treatment - follow trend - patient confirms she has not smoked in years - wean to oral steroids - resume inhaled steroid   Sepsis due to LLL HCAP At presentation WBC 14.7 + HR 115 = sepsis - cont empiric abx coverage - hemodynamically stable    ?Acute CHF exacerbation TTE at [facility] April 2016 noted EF 55-60% but evidence of DD and mild AoS - f/u TTE not felt to be necessary at  present - BNP here only modestly elevated - CXR today notes increased pulm edema/effusion but clinically no evidence of significant volume overload - resume home lasix dose and follow   Chronic A-fib with acute RVR Rate reasonably well controlled off cardizem gtt but having episodes of tachy (130 at time of exam) - follow on tele - CHA2DS2-VASc 4+ - cont Pradaxa - resume home BB - change to xopenex neb   Acute renal insufficiency - NO CKD crt at [facility] in August NORMAL at 0.63 - renal fxn has not yet returned to normal, but is stable - resume lasix and follow   Hx of PE  Cont pradaxa  HTN BP now normal - follow w/ resumption of home meds   Hypothyroidism TSH July 1.15 - cont home dose synthroid   CAD s/p CABG - very mildly elevated troponin Denies cp - trop not significantly elevated initially but then increased to 0.16 - recheck troponin today - Stress test 03/2014 SPECT at [facility]: Abnormal probably low risk study   Hx esophageal CA  Code Status: FULL Family Communication: no family present at time of exam Disposition Plan: SDU  Consultants: none  Procedures: none  Antibiotics: Levaquin 10/14 > Vanc 10/14 > 10/16  DVT prophylaxis: pradaxa  Objective: Blood pressure 105/66, pulse 99, temperature 97.5 F (36.4 C), temperature source Oral, resp. rate 12, height 5' (1.524 m), weight 55.384 kg (122 lb 1.6 oz), SpO2 99 %.  Intake/Output  Summary (Last 24 hours) at 09/16/15 0917 Last data filed at 09/16/15 0806  Gross per 24 hour  Intake    490 ml  Output   1200 ml  Net   -710 ml   Exam: General: no resp distress on [place] at 2L  Lungs: no wheezing appreciated today - bibasilar crackles left >right  Cardiovascular: tachycardic at ~130bpm - irreg irreg - no JVD Abdomen: Nontender, nondistended, soft, bowel sounds positive, no rebound, no ascites, no appreciable mass Extremities: No significant cyanosis, clubbing, edema bilateral lower extremities  Data Reviewed: Basic Metabolic  Panel:  Recent Labs Lab 09/15/15 0345 09/16/15 0240  NA 133* 134*  K 4.6 3.9  CL 91* 90*  CO2 31 34*  GLUCOSE 115* 168*  BUN 27* 31*  CREATININE 1.07* 1.07*  CALCIUM 8.6* 8.7*    CBC:  Recent Labs Lab 09/15/15 0345 09/16/15 0240  WBC 14.7* 13.3*  HGB 12.3 12.0  HCT 40.8 39.2  MCV 93.4 92.0  PLT 155 193    Liver Function Tests:  Recent Labs Lab 09/15/15 0345 09/16/15 0240  AST 49* 31  ALT 92* 66*  ALKPHOS 64 63  BILITOT 1.2 0.4  PROT 6.0* 5.7*  ALBUMIN 3.0* 2.9*   Cardiac Enzymes:  Recent Labs Lab 09/14/15 2256 09/15/15 0345 09/15/15 0955  TROPONINI 0.04* 0.04* 0.16*    Recent Results (from the past 240 hour(s))  MRSA PCR Screening     Status: None   Collection Time: 09/14/15  6:28 PM  Result Value Ref Range Status   MRSA by PCR NEGATIVE NEGATIVE Final    Comment:        The GeneXpert MRSA Assay (FDA approved for NASAL specimens only), is one component of a comprehensive MRSA colonization surveillance program. It is not intended to diagnose MRSA infection nor to guide or monitor treatment for MRSA infections.   Urine culture     Status: None   Collection Time: 09/15/15 12:24 AM  Result Value Ref Range Status   Specimen Description URINE, CLEAN CATCH  Final   Special Requests NONE  Final   Culture NO GROWTH 1 DAY  Final   Report Status 09/16/2015 FINAL  Final     Studies:   Recent x-ray studies have been reviewed in detail by the Attending Physician  Scheduled Meds:  Scheduled Meds: . antiseptic oral rinse  7 mL Mouth Rinse q12n4p  . atorvastatin  40 mg Oral QHS  . chlorhexidine  15 mL Mouth Rinse BID  . dabigatran  150 mg Oral Q12H  . diltiazem  30 mg Oral 4 times per day  . Influenza vac split quadrivalent PF  0.5 mL Intramuscular Tomorrow-1000  . ipratropium-albuterol  3 mL Nebulization Q6H  . levofloxacin (LEVAQUIN) IV  500 mg Intravenous Q24H  . levothyroxine  50 mcg Oral QAC breakfast  . methylPREDNISolone (SOLU-MEDROL)  injection  60 mg Intravenous Daily  . sodium chloride  3 mL Intravenous Q12H  . vancomycin  1,000 mg Intravenous Q24H    Time spent on care of this patient: 35 mins   Amesha Bailey T , MD   [facility] Office  [phone] Pager - Text Page per Shea Evans as per below:  On-Call/Text Page:      [email or web address]      password [email or web address]  If 7PM-7AM, please contact night-coverage [email or web address] Password [email or web address] 09/16/2015, 9:17 AM   LOS: 2 days

## 2015-09-16 NOTE — Progress Notes (Signed)
Pt ambulated with nursing 150 feet with rolling walker. Pt's heartrate was in 130's before started, up to 170 for about 5 seconds, then mostly stayedin 130 to 140 range during the walk. Pt ambulated without oxygen and sat was 90%. Pt extremely short of breath when reached her room after walk. Oxygen replaced and sitting in chair. Lindsey Mejia

## 2015-09-16 NOTE — Progress Notes (Signed)
Utilization Review Completed.Aleksandr Pellow T10/16/2016  

## 2015-09-17 DIAGNOSIS — I481 Persistent atrial fibrillation: Secondary | ICD-10-CM

## 2015-09-17 DIAGNOSIS — N183 Chronic kidney disease, stage 3 (moderate): Secondary | ICD-10-CM

## 2015-09-17 LAB — BASIC METABOLIC PANEL
ANION GAP: 9 (ref 5–15)
BUN: 26 mg/dL — AB (ref 6–20)
CHLORIDE: 92 mmol/L — AB (ref 101–111)
CO2: 35 mmol/L — ABNORMAL HIGH (ref 22–32)
Calcium: 8.8 mg/dL — ABNORMAL LOW (ref 8.9–10.3)
Creatinine, Ser: 1.06 mg/dL — ABNORMAL HIGH (ref 0.44–1.00)
GFR calc Af Amer: >60 mL/min (ref >60)
GFR, EST NON AFRICAN AMERICAN: 53 mL/min — AB (ref >60)
Glucose, Bld: 146 mg/dL — ABNORMAL HIGH (ref 65–99)
POTASSIUM: 4.5 mmol/L (ref 3.5–5.1)
SODIUM: 136 mmol/L (ref 135–145)

## 2015-09-17 LAB — CBC
HEMATOCRIT: 41.3 % (ref 36.0–46.0)
HEMOGLOBIN: 12.6 g/dL (ref 12.0–15.0)
MCH: 28.3 pg (ref 26.0–34.0)
MCHC: 30.5 g/dL (ref 30.0–36.0)
MCV: 92.6 fL (ref 78.0–100.0)
Platelets: 220 10*3/uL (ref 150–400)
RBC: 4.46 MIL/uL (ref 3.87–5.11)
RDW: 17.9 % — AB (ref 11.5–15.5)
WBC: 16.5 10*3/uL — AB (ref 4.0–10.5)

## 2015-09-17 LAB — TROPONIN I: Troponin I: 0.07 ng/mL — ABNORMAL HIGH (ref <0.031)

## 2015-09-17 MED ORDER — DILTIAZEM HCL ER COATED BEADS 360 MG PO CP24
360.0000 mg | ORAL_CAPSULE | Freq: Every day | ORAL | Status: DC
  Administered 2015-09-17: 360 mg via ORAL
  Filled 2015-09-17: qty 1

## 2015-09-17 MED ORDER — DILTIAZEM LOAD VIA INFUSION
10.0000 mg | Freq: Once | INTRAVENOUS | Status: AC
  Administered 2015-09-17: 10 mg via INTRAVENOUS
  Filled 2015-09-17: qty 10

## 2015-09-17 MED ORDER — LEVOFLOXACIN 500 MG PO TABS
500.0000 mg | ORAL_TABLET | Freq: Every day | ORAL | Status: AC
  Administered 2015-09-17 – 2015-09-21 (×5): 500 mg via ORAL
  Filled 2015-09-17 (×5): qty 1

## 2015-09-17 MED ORDER — DILTIAZEM HCL 100 MG IV SOLR
5.0000 mg/h | INTRAVENOUS | Status: DC
  Administered 2015-09-17 – 2015-09-18 (×3): 5 mg/h via INTRAVENOUS
  Filled 2015-09-17 (×3): qty 100

## 2015-09-17 MED ORDER — DIAZEPAM 5 MG PO TABS
10.0000 mg | ORAL_TABLET | Freq: Four times a day (QID) | ORAL | Status: DC | PRN
  Administered 2015-09-17 – 2015-09-20 (×5): 10 mg via ORAL
  Filled 2015-09-17 (×5): qty 2

## 2015-09-17 NOTE — Progress Notes (Signed)
Pt HR in the 60s with BP of 110/68 on 5mg  of Cardizem. Notified Dr. Thereasa Solo of vitals. Per MD plan to keep pt on drip throughout the night and reassess in the am. Will continue to monitor closely.

## 2015-09-17 NOTE — Evaluation (Signed)
Physical Therapy Evaluation Patient Details Name: Lindsey Mejia MRN: [id number] DOB: 02-22-1946 Today's Date: 09/17/2015   History of Present Illness  69yo F with Hx of COPD on 2L oxygen at night, Afib with RVR on dabigitran, CAD s/p CABG in 1997 and PCI in 2013, PE in 2013, HTN, Hypothyroidism, and GERD who presented to [facility] with acute respiratory failure with O2 saturations in the 50s. She was started initially on NRB and then on Bipap. CXR showed apparent LLL infiltrate  Clinical Impression  Pt admitted with above diagnosis. Pt currently with functional limitations due to the deficits listed below (see PT Problem List).Pt should progress well and need no f/u PT.  Has equipment prn.  Will follow acutely.   Pt will benefit from skilled PT to increase their independence and safety with mobility to allow discharge to the venue listed below.      Follow Up Recommendations No PT follow up;Supervision - Intermittent    Equipment Recommendations  None recommended by PT    Recommendations for Other Services       Precautions / Restrictions Precautions Precautions: Other (comment) Precaution Comments: watch HR Restrictions Weight Bearing Restrictions: No      Mobility  Bed Mobility Overal bed mobility: Modified Independent                Transfers Overall transfer level: Modified independent                  Ambulation/Gait Ambulation/Gait assistance: Min guard Ambulation Distance (Feet): 125 Feet Assistive device: None Gait Pattern/deviations: Step-through pattern;Decreased stride length;Drifts right/left   Gait velocity interpretation: Below normal speed for age/gender General Gait Details: Overall ambulates well.  No LOB however no challenges to balance either.  Some DOE 3/4 noted and therefore turned around.  O2 sats 88-92% on RA with ambulation. 93% and > once on RA at rest.   Stairs            Wheelchair Mobility    Modified Rankin  (Stroke Patients Only)       Balance Overall balance assessment: No apparent balance deficits (not formally assessed)                                           Pertinent Vitals/Pain Pain Assessment: No/denies pain  Sats 88-92% on RA with ambulation.  90% and > at rest.    Home Living Family/patient expects to be discharged to:: Private residence Living Arrangements: Children Available Help at Discharge: Family;Available PRN/intermittently Type of Home: Mobile home Home Access: Stairs to enter   Entrance Stairs-Number of Steps: 3-4 Home Layout: One level Home Equipment: Shower seat      Prior Function Level of Independence: Independent               Hand Dominance   Dominant Hand: Right    Extremity/Trunk Assessment   Upper Extremity Assessment: Defer to OT evaluation           Lower Extremity Assessment: Generalized weakness      Cervical / Trunk Assessment: Normal  Communication   Communication: No difficulties  Cognition Arousal/Alertness: Awake/alert Behavior During Therapy: WFL for tasks assessed/performed Overall Cognitive Status: Within Functional Limits for tasks assessed                      General Comments      Exercises  Assessment/Plan    PT Assessment Patient needs continued PT services  PT Diagnosis Generalized weakness   PT Problem List Decreased activity tolerance;Decreased balance;Decreased mobility;Decreased knowledge of use of DME;Decreased safety awareness;Decreased knowledge of precautions  PT Treatment Interventions DME instruction;Gait training;Functional mobility training;Therapeutic activities;Therapeutic exercise;Balance training;Patient/family education   PT Goals (Current goals can be found in the Care Plan section) Acute Rehab PT Goals Patient Stated Goal: to go home PT Goal Formulation: With patient Time For Goal Achievement: 09/24/15 Potential to Achieve Goals: Good     Frequency Min 3X/week   Barriers to discharge        Co-evaluation               End of Session Equipment Utilized During Treatment: Gait belt;Oxygen Activity Tolerance: Patient limited by fatigue Patient left: in chair;with call bell/phone within reach Nurse Communication: Mobility status         Time: 1445-1456 PT Time Calculation (min) (ACUTE ONLY): 11 min   Charges:   PT Evaluation $Initial PT Evaluation Tier I: 1 Procedure     PT G CodesDenice Mejia 09-18-15, 3:26 PM  Lindsey Mejia [facility] Acute Rehabilitation [phone] (pager)

## 2015-09-17 NOTE — Progress Notes (Signed)
Occupational Therapy Evaluation Patient Details Name: Lindsey Mejia MRN: [id number] DOB: Apr 26, 1946 Today's Date: 09/17/2015    History of Present Illness 69yo F with Hx of COPD on 2L oxygen at night, Afib with RVR on dabigitran, CAD s/p CABG in 1997 and PCI in 2013, PE in 2013, HTN, Hypothyroidism, and GERD who presented to [facility] with acute respiratory failure with O2 saturations in the 50s. She was started initially on NRB and then on Bipap. CXR showed apparent LLL infiltrate   Clinical Impression   PTA, pt independent with ADL and mobility. Pt completing ADL and mobility overall with S. Vitals stable. Unfortunately pt's husband passed away unexpectedly 09-24-23. Pt lives with her daughter who can provide intermittent S after D/C. Pt safe to D/C home when medically stable. Will follow acutely to address established goals.     Follow Up Recommendations  Supervision - Intermittent;No OT follow up    Equipment Recommendations  None recommended by OT    Recommendations for Other Services       Precautions / Restrictions Precautions Precautions: Other (comment) Precaution Comments: watch HR Restrictions Weight Bearing Restrictions: No      Mobility Bed Mobility Overal bed mobility: Modified Independent                Transfers Overall transfer level: Modified independent                    Balance Overall balance assessment: No apparent balance deficits (not formally assessed)                                          ADL Overall ADL's : Needs assistance/impaired     Grooming: Set up;Standing   Upper Body Bathing: Set up;Sitting   Lower Body Bathing: Set up;Sit to/from stand   Upper Body Dressing : Set up;Sitting   Lower Body Dressing: Set up;Sit to/from stand   Toilet Transfer: Sales executive;Ambulation   Toileting- Clothing Manipulation and Hygiene: Supervision/safety;Sit to/from stand        Functional mobility during ADLs: Supervision/safety General ADL Comments: Pt completed grooming and toileting task with min SOB. HR stable. No LOB      Vision     Perception     Praxis      Pertinent Vitals/Pain  no c/o pain Vitals stable     Hand Dominance Right   Extremity/Trunk Assessment Upper Extremity Assessment Upper Extremity Assessment: Overall WFL for tasks assessed   Lower Extremity Assessment Lower Extremity Assessment: Defer to PT evaluation   Cervical / Trunk Assessment Cervical / Trunk Assessment: Normal   Communication Communication Communication: No difficulties   Cognition Arousal/Alertness: Awake/alert Behavior During Therapy: WFL for tasks assessed/performed Overall Cognitive Status: Within Functional Limits for tasks assessed                     General Comments       Exercises       Shoulder Instructions      Home Living Family/patient expects to be discharged to:: Private residence Living Arrangements: Children Available Help at Discharge: Family;Available PRN/intermittently Type of Home: Mobile home Home Access: Stairs to enter Entrance Stairs-Number of Steps: 3-4   Home Layout: One level     Bathroom Shower/Tub: Walk-in shower;Tub/shower unit Shower/tub characteristics: Architectural technologist: Standard Bathroom Accessibility: Yes How Accessible: Accessible via walker Home Equipment: Shower seat  Prior Functioning/Environment Level of Independence: Independent             OT Diagnosis: Generalized weakness   OT Problem List: Decreased activity tolerance;Decreased strength;Cardiopulmonary status limiting activity   OT Treatment/Interventions: Self-care/ADL training;Therapeutic exercise;Energy conservation;DME and/or AE instruction;Balance training    OT Goals(Current goals can be found in the care plan section) Acute Rehab OT Goals Patient Stated Goal: to go home OT Goal Formulation: With  patient Time For Goal Achievement: 10/01/15 Potential to Achieve Goals: Good ADL Goals Pt Will Transfer to Toilet: with modified independence;ambulating Pt Will Perform Toileting - Clothing Manipulation and hygiene: with modified independence;sit to/from stand Additional ADL Goal #1: Pt will verbalized understanding of 3 energy conservation techniques for ADL  OT Frequency: Min 2X/week   Barriers to D/C:    Husband passed away unexpectedly last night (10/16)       Co-evaluation              End of Session Nurse Communication: Mobility status  Activity Tolerance: Patient tolerated treatment well Patient left: Other (comment) (with PT)   Time: 9211-9417 OT Time Calculation (min): 14 min Charges:  OT General Charges $OT Visit: 1 Procedure OT Evaluation $Initial OT Evaluation Tier I: 1 Procedure G-Codes:    Wrigley Plasencia,HILLARY 10-Oct-2015, 3:09 PM   Maurie Boettcher, OTR/L  [phone] 2015/10/10

## 2015-09-17 NOTE — Progress Notes (Signed)
   09/17/15 1140  Clinical Encounter Type  Visited With Patient  Visit Type Spiritual support  Referral From Nurse  Spiritual Encounters  Spiritual Needs Emotional;Grief support  Stress Factors  Patient Stress Factors Family relationships;Major life changes  Patient admitted with pneumonia and heart issues, found out last night that husband died. Chaplain sat with her while she talked, although not very much, until her sedative helped her relax.

## 2015-09-17 NOTE — Progress Notes (Signed)
TEAM 1 - Stepdown/ICU TEAM PROGRESS NOTE  Katheleen Stella JSH:[id number] DOB: 1946/08/29 DOA: 09/14/2015 PCP: Azzie Almas, MD  Admit HPI / Brief Narrative: 69yo F with Hx of COPD on 2L oxygen at night, Afib with RVR on dabigitran, CAD s/p CABG in 1997 and PCI in 2013, PE in 2013, HTN, Hypothyroidism, and GERD who presented to [facility] with acute respiratory failure with O2 saturations in the 50s. She was started initially on NRB and then on Bipap.  CXR showed apparent LLL infiltrate. She had a BNP of 11,500. She was initially given IV lasix and nitropatch, however, her blood pressure could not tolerate the nitropatch and this was discontinued. BC were ordered, sputum culture unable to be obtained. She was given Levaquin and Vancomycin in the ED along with solumedrol 125mg . Foley was placed. She was given metoprolol 5mg  IV for her elevated HR.   ABG on BIPAP showed 7.34/63/119, Lactate was 3.5, trended down to 1.7. Antibiotics were chosen given recent admission to [facility] in August of 2016 (2 months). CBC was 19, Cr 1.2. TnI initially negative.   HPI/Subjective: The patient denies shortness of breath this time.  She has had difficulty in the last 24 hours with tachycardia.  When she ambulated yesterday afternoon her sats remained at 88% or greater but her heart rate increased to approximately 170.  She denies chest pain nausea vomiting or abdominal pain.  Assessment/Plan:  Chronic A-fib with acute RVR Rate has been poorly controlled over last 24 hours and was greatly exacerbated with physical exertion - CHA2DS2-VASc 4+ - cont Pradaxa - resumed home BB yesterday - changed to xopenex neb yesterday - adjust medical therapy and follow today - rate will need to be controlled, including with ambulation, prior to discharge home  Acute on chronic hypoxic and hypercarbic respiratory failure - multifactorial (see below) Resolved with patient successfully weaned to room  air  COPD exacerbation patient confirms she has not smoked in years - much improved with no active wheeze on exam today  Sepsis due to LLL HCAP At presentation WBC 14.7 + HR 115 = sepsis - cont empiric abx coverage for 7 days of treatment - hemodynamically stable    ?Acute CHF exacerbation TTE at [facility] April 2016 noted EF 55-60% but evidence of DD and mild AoS - f/u TTE not felt to be necessary at present - BNP here only modestly elevated - CXR 10/16 noted increased pulm edema/effusion but clinically no evidence of significant volume overload - resumed home lasix dose   Acute renal insufficiency - NO CKD crt at [facility] in August NORMAL at 0.63 - renal fxn has not yet returned to normal, but is stable - resumed lasix - follow   Hx of PE  Cont pradaxa  HTN BP poorly controlled - follow w/ adjustment in rate controlling meds   Hypothyroidism TSH July 1.15 - cont home dose synthroid   CAD s/p CABG - very mildly elevated troponin Denies cp - trop not significantly elevated initially but then increased to 0.16 - recheck troponin improved - Stress test 03/2014 SPECT at [facility]: "abnormal probably low risk study"   Hx esophageal CA  Code Status: FULL Family Communication: no family present at time of exam Disposition Plan: SDU  Consultants: none  Procedures: none  Antibiotics: Levaquin 10/14 > Vanc 10/14 > 10/16  DVT prophylaxis: pradaxa  Objective: Blood pressure 163/103, pulse 104, temperature 97.5 F (36.4 C), temperature source Oral, resp. rate 18, height 5' (1.524 m), weight  55.8 kg (123 lb 0.3 oz), SpO2 100 %.  Intake/Output Summary (Last 24 hours) at 09/17/15 1024 Last data filed at 09/17/15 0944  Gross per 24 hour  Intake    543 ml  Output   1650 ml  Net  -1107 ml   Exam: General: on RA in no acute distress   Lungs: no wheezing - bibasilar crackles left >right less pronounced today  Cardiovascular: tachycardic at ~130-140bpm - irreg irreg - no JVD Abdomen: Nontender,  nondistended, soft, bowel sounds positive, no rebound, no ascites, no appreciable mass Extremities: No significant cyanosis, clubbing, or edema bilateral lower extremities  Data Reviewed: Basic Metabolic Panel:  Recent Labs Lab 09/15/15 0345 09/16/15 0240 09/17/15 0243  NA 133* 134* 136  K 4.6 3.9 4.5  CL 91* 90* 92*  CO2 31 34* 35*  GLUCOSE 115* 168* 146*  BUN 27* 31* 26*  CREATININE 1.07* 1.07* 1.06*  CALCIUM 8.6* 8.7* 8.8*    CBC:  Recent Labs Lab 09/15/15 0345 09/16/15 0240 09/17/15 0243  WBC 14.7* 13.3* 16.5*  HGB 12.3 12.0 12.6  HCT 40.8 39.2 41.3  MCV 93.4 92.0 92.6  PLT 155 193 220    Liver Function Tests:  Recent Labs Lab 09/15/15 0345 09/16/15 0240  AST 49* 31  ALT 92* 66*  ALKPHOS 64 63  BILITOT 1.2 0.4  PROT 6.0* 5.7*  ALBUMIN 3.0* 2.9*   Cardiac Enzymes:  Recent Labs Lab 09/14/15 2256 09/15/15 0345 09/15/15 0955 09/16/15 1054 09/17/15 0233  TROPONINI 0.04* 0.04* 0.16* 0.05* 0.07*    Recent Results (from the past 240 hour(s))  MRSA PCR Screening     Status: None   Collection Time: 09/14/15  6:28 PM  Result Value Ref Range Status   MRSA by PCR NEGATIVE NEGATIVE Final    Comment:        The GeneXpert MRSA Assay (FDA approved for NASAL specimens only), is one component of a comprehensive MRSA colonization surveillance program. It is not intended to diagnose MRSA infection nor to guide or monitor treatment for MRSA infections.   Culture, blood (routine x 2)     Status: None (Preliminary result)   Collection Time: 09/14/15 11:20 PM  Result Value Ref Range Status   Specimen Description BLOOD RIGHT ARM  Final   Special Requests BOTTLES DRAWN AEROBIC AND ANAEROBIC 10CC   Final   Culture NO GROWTH 1 DAY  Final   Report Status PENDING  Incomplete  Culture, blood (routine x 2)     Status: None (Preliminary result)   Collection Time: 09/14/15 11:30 PM  Result Value Ref Range Status   Specimen Description BLOOD LEFT ARM  Final    Special Requests BOTTLES DRAWN AEROBIC AND ANAEROBIC 10CC   Final   Culture NO GROWTH 1 DAY  Final   Report Status PENDING  Incomplete  Urine culture     Status: None   Collection Time: 09/15/15 12:24 AM  Result Value Ref Range Status   Specimen Description URINE, CLEAN CATCH  Final   Special Requests NONE  Final   Culture NO GROWTH 1 DAY  Final   Report Status 09/16/2015 FINAL  Final     Studies:   Recent x-ray studies have been reviewed in detail by the Attending Physician  Scheduled Meds:  Scheduled Meds: . antiseptic oral rinse  7 mL Mouth Rinse q12n4p  . aspirin  81 mg Oral Daily  . budesonide (PULMICORT) nebulizer solution  0.25 mg Nebulization BID  . chlorhexidine  15  mL Mouth Rinse BID  . dabigatran  150 mg Oral Q12H  . diltiazem  360 mg Oral Daily  . famotidine  20 mg Oral Daily  . furosemide  40 mg Oral Daily  . ipratropium  0.5 mg Nebulization QID  . isosorbide mononitrate  60 mg Oral Daily  . levalbuterol  0.63 mg Nebulization QID  . levofloxacin (LEVAQUIN) IV  500 mg Intravenous Q24H  . levothyroxine  50 mcg Oral QAC breakfast  . metoprolol succinate  100 mg Oral QHS  . metoprolol succinate  200 mg Oral Daily  . predniSONE  40 mg Oral Q breakfast  . sodium chloride  3 mL Intravenous Q12H    Time spent on care of this patient: 35 mins   MCCLUNG,JEFFREY T , MD   [facility] Office  [phone] Pager - Text Page per Shea Evans as per below:  On-Call/Text Page:      [email or web address]      password [email or web address]  If 7PM-7AM, please contact night-coverage [email or web address] Password [email or web address] 09/17/2015, 10:24 AM   LOS: 3 days

## 2015-09-18 ENCOUNTER — Other Ambulatory Visit (HOSPITAL_COMMUNITY): Payer: Self-pay

## 2015-09-18 ENCOUNTER — Inpatient Hospital Stay (HOSPITAL_COMMUNITY): Payer: Medicare (Managed Care)

## 2015-09-18 DIAGNOSIS — J441 Chronic obstructive pulmonary disease with (acute) exacerbation: Secondary | ICD-10-CM

## 2015-09-18 DIAGNOSIS — I509 Heart failure, unspecified: Secondary | ICD-10-CM

## 2015-09-18 DIAGNOSIS — J9621 Acute and chronic respiratory failure with hypoxia: Secondary | ICD-10-CM | POA: Diagnosis present

## 2015-09-18 DIAGNOSIS — Z951 Presence of aortocoronary bypass graft: Secondary | ICD-10-CM | POA: Diagnosis present

## 2015-09-18 DIAGNOSIS — J9622 Acute and chronic respiratory failure with hypercapnia: Secondary | ICD-10-CM

## 2015-09-18 DIAGNOSIS — I4891 Unspecified atrial fibrillation: Secondary | ICD-10-CM | POA: Diagnosis present

## 2015-09-18 DIAGNOSIS — J189 Pneumonia, unspecified organism: Secondary | ICD-10-CM

## 2015-09-18 DIAGNOSIS — N289 Disorder of kidney and ureter, unspecified: Secondary | ICD-10-CM | POA: Diagnosis present

## 2015-09-18 DIAGNOSIS — A419 Sepsis, unspecified organism: Principal | ICD-10-CM | POA: Diagnosis present

## 2015-09-18 LAB — BASIC METABOLIC PANEL WITH GFR
Anion gap: 10 (ref 5–15)
BUN: 31 mg/dL — ABNORMAL HIGH (ref 6–20)
CO2: 32 mmol/L (ref 22–32)
Calcium: 8.9 mg/dL (ref 8.9–10.3)
Chloride: 97 mmol/L — ABNORMAL LOW (ref 101–111)
Creatinine, Ser: 0.93 mg/dL (ref 0.44–1.00)
GFR calc Af Amer: >60 mL/min
GFR calc non Af Amer: >60 mL/min
Glucose, Bld: 107 mg/dL — ABNORMAL HIGH (ref 65–99)
Potassium: 3.5 mmol/L (ref 3.5–5.1)
Sodium: 139 mmol/L (ref 135–145)

## 2015-09-18 LAB — CBC
HCT: 40.8 % (ref 36.0–46.0)
Hemoglobin: 12.5 g/dL (ref 12.0–15.0)
MCH: 28.3 pg (ref 26.0–34.0)
MCHC: 30.6 g/dL (ref 30.0–36.0)
MCV: 92.3 fL (ref 78.0–100.0)
Platelets: 223 K/uL (ref 150–400)
RBC: 4.42 MIL/uL (ref 3.87–5.11)
RDW: 17.8 % — ABNORMAL HIGH (ref 11.5–15.5)
WBC: 13.5 K/uL — ABNORMAL HIGH (ref 4.0–10.5)

## 2015-09-18 LAB — MAGNESIUM: Magnesium: 2 mg/dL (ref 1.7–2.4)

## 2015-09-18 MED ORDER — METOPROLOL SUCCINATE ER 50 MG PO TB24
150.0000 mg | ORAL_TABLET | Freq: Every day | ORAL | Status: DC
  Administered 2015-09-18 – 2015-09-20 (×3): 150 mg via ORAL
  Filled 2015-09-18 (×6): qty 1

## 2015-09-18 MED ORDER — DM-GUAIFENESIN ER 30-600 MG PO TB12
1.0000 | ORAL_TABLET | Freq: Two times a day (BID) | ORAL | Status: DC
  Administered 2015-09-18 – 2015-09-19 (×2): 1 via ORAL
  Filled 2015-09-18 (×2): qty 1

## 2015-09-18 MED ORDER — METHYLPREDNISOLONE SODIUM SUCC 125 MG IJ SOLR
60.0000 mg | INTRAMUSCULAR | Status: DC
  Administered 2015-09-18: 60 mg via INTRAVENOUS
  Filled 2015-09-18: qty 2

## 2015-09-18 NOTE — Discharge Instructions (Signed)

## 2015-09-18 NOTE — Progress Notes (Signed)
   09/18/15 1600  Clinical Encounter Type  Visited With Patient  Visit Type Spiritual support  Referral From Nurse  Spiritual Encounters  Spiritual Needs Emotional  Stress Factors  Patient Stress Factors Health changes;Other (Comment) (Death of husband)  Spent time with Javier letting her talk about her husband and family and dogs. She is still worried about being here and missing her husband's funeral, which they cannot delay for long. Doristine Bosworth visited her yesterday, and friends and family call. Family caring for home and pets.

## 2015-09-18 NOTE — Progress Notes (Signed)
  Echocardiogram 2D Echocardiogram has been performed.  Darlina Sicilian M 09/18/2015, 3:16 PM

## 2015-09-18 NOTE — Progress Notes (Signed)
[facility] TEAM 1 - Stepdown/ICU TEAM Progress Note  Lindsey Mejia VZC:[id number] DOB: Jul 17, 1946 DOA: 09/14/2015 PCP: Azzie Almas, MD  Admit HPI / Brief Narrative: 12IN OM with PMHx of COPD on 2L oxygen at night, Afib with RVR on Dabigitran, CAD s/p CABG in 1997 and PCI in 2013, PE in 2013, HTN, Hypothyroidism, and GERD   Presented to [facility] with acute respiratory failure with O2 saturations in the 50s. She was started initially on NRB and then on Bipap. CXR showed apparent LLL infiltrate. She had a BNP of 11,500. She was initially given IV lasix and nitropatch, however, her blood pressure could not tolerate the nitropatch and this was discontinued. BC were ordered, sputum culture unable to be obtained. She was given Levaquin and Vancomycin in the ED along with solumedrol 125mg . Foley was placed. She was given metoprolol 5mg  IV for her elevated HR.   ABG on BIPAP showed 7.34/63/119, Lactate was 3.5, trended down to 1.7. Antibiotics were chosen given recent admission to [facility] in August of 2016 (2 months). CBC was 19, Cr 1.2. TnI initially negative.    HPI/Subjective: 10/18  A/O 4, patient states is on 2 L O2 via [place] at night, during the day negative O2 requirement. States was told at one time that she had OSA but never obtained sleep study. Currently negative CP, negative SOB. States she had CABG 3 vessels ~30 years ago   Assessment/Plan: Chronic A-fib with acute RVR -Rate has been better controlled over last 24 hours, however patient has not exerted herself. - CHA2DS2-VASc 4+ - cont Pradaxa  - resumed home BB yesterday - -ambulate q shift; patient's husband died will attempt to get patient home ASAP  Acute on chronic hypoxic and hypercarbic respiratory failure - multifactorial (see below) -Continues to be SOB may have to start O2 during times of exertion+ at bedtime -Ambulatory SPO2  COPD exacerbation -Continue current antibiotics for 7 days  -Xopenex  QID -Flutter valve -Solu-Medrol 60 mg daily -Mucinex DM BID  Sepsis due to LLL HCAP -At presentation WBC 14.7 + HR 115 = sepsis  - cont empiric abx coverage for 7 days of treatment - hemodynamically stable   ?Acute CHF exacerbation TTE at [facility] April 2016 noted EF 55-60% but evidence of DD and mild AoS -Echocardiogram pending  - CXR 10/16 noted increased pulm edema/effusion -Lasix 40 mg daily -Imdur 60 mg daily -Metoprolol XL 150 mg QHS -Metoprolol XL 200 mg daily -Unknown dry weight -Daily weights on standing scale -Strict in and out  Acute renal insufficiency - NO CKD crt at [facility] in August NORMAL at 0.63 - renal fxn has not yet returned to normal, but is stable - resumed lasix - follow   Hx of PE  Cont pradaxa  HTN -See acute CHF    Hypothyroidism TSH July 1.15 - cont home dose synthroid   CAD s/p CABG 3 vessel - very mildly elevated troponin Denies cp - trop not significantly elevated initially but then increased to 0.16 - recheck troponin improved - Stress test 03/2014 SPECT at [facility]: "abnormal probably low risk study"  Hx esophageal CA    Code Status: FULL Family Communication: no family present at time of exam Disposition Plan: DC in 24-48 hours; needs to attend husband's funeral    Consultants: NA  Procedure/Significant Events: Echocardiogram pending   Culture   Antibiotics: Levaquin 10/14 > Vanc 10/14 > 10/16  DVT prophylaxis:     Devices NA   LINES / TUBES:  NA  Continuous Infusions: . diltiazem (CARDIZEM) infusion 5 mg/hr (09/18/15 1822)    Objective: VITAL SIGNS: Temp: 97.4 F (36.3 C) (10/18 1949) Temp Source: Oral (10/18 1949) BP: 156/81 mmHg (10/18 1949) Pulse Rate: 96 (10/18 1949) SPO2; FIO2:   Intake/Output Summary (Last 24 hours) at 09/18/15 2002 Last data filed at 09/18/15 1900  Gross per 24 hour  Intake    820 ml  Output   2600 ml  Net  -1780 ml     Exam: General: A/O 4, positive acute on chronic  respiratory distress Eyes: Negative headache, eye pain, double vision,negative scleral hemorrhage ENT: Negative Runny nose, negative ear pain, negative gingival bleeding, Neck:  Negative scars, masses, torticollis, lymphadenopathy, JVD Lungs: positive diffuse rhonchi Lt>>Rt , positive diffuse expiratory wheezing, negative crackles  Cardiovascular: Regular rate and rhythm without murmur gallop or rub normal S1 and S2 Abdomen:negative abdominal pain, nondistended, positive soft, bowel sounds, no rebound, no ascites, no appreciable mass Extremities: No significant cyanosis, clubbing, or edema bilateral lower extremities Psychiatric:  Negative depression, negative anxiety, negative fatigue, negative mania Neurologic:  Cranial nerves II through XII intact, tongue/uvula midline, all extremities muscle strength 5/5, sensation intact throughout, negative dysarthria, negative expressive aphasia, negative receptive aphasia.   Data Reviewed: Basic Metabolic Panel:  Recent Labs Lab 09/15/15 0345 09/16/15 0240 09/17/15 0243 09/18/15 0222  NA 133* 134* 136 139  K 4.6 3.9 4.5 3.5  CL 91* 90* 92* 97*  CO2 31 34* 35* 32  GLUCOSE 115* 168* 146* 107*  BUN 27* 31* 26* 31*  CREATININE 1.07* 1.07* 1.06* 0.93  CALCIUM 8.6* 8.7* 8.8* 8.9  MG  --   --   --  2.0   Liver Function Tests:  Recent Labs Lab 09/15/15 0345 09/16/15 0240  AST 49* 31  ALT 92* 66*  ALKPHOS 64 63  BILITOT 1.2 0.4  PROT 6.0* 5.7*  ALBUMIN 3.0* 2.9*   No results for input(s): LIPASE, AMYLASE in the last 168 hours. No results for input(s): AMMONIA in the last 168 hours. CBC:  Recent Labs Lab 09/15/15 0345 09/16/15 0240 09/17/15 0243 09/18/15 0222  WBC 14.7* 13.3* 16.5* 13.5*  HGB 12.3 12.0 12.6 12.5  HCT 40.8 39.2 41.3 40.8  MCV 93.4 92.0 92.6 92.3  PLT 155 193 220 223   Cardiac Enzymes:  Recent Labs Lab 09/14/15 2256 09/15/15 0345 09/15/15 0955 09/16/15 1054 09/17/15 0233  TROPONINI 0.04* 0.04* 0.16*  0.05* 0.07*   BNP (last 3 results)  Recent Labs  09/15/15 0345  BNP 694.0*    ProBNP (last 3 results) No results for input(s): PROBNP in the last 8760 hours.  CBG: No results for input(s): GLUCAP in the last 168 hours.  Recent Results (from the past 240 hour(s))  MRSA PCR Screening     Status: None   Collection Time: 09/14/15  6:28 PM  Result Value Ref Range Status   MRSA by PCR NEGATIVE NEGATIVE Final    Comment:        The GeneXpert MRSA Assay (FDA approved for NASAL specimens only), is one component of a comprehensive MRSA colonization surveillance program. It is not intended to diagnose MRSA infection nor to guide or monitor treatment for MRSA infections.   Culture, blood (routine x 2)     Status: None (Preliminary result)   Collection Time: 09/14/15 11:20 PM  Result Value Ref Range Status   Specimen Description BLOOD RIGHT ARM  Final   Special Requests BOTTLES DRAWN AEROBIC AND ANAEROBIC 10CC   Final  Culture NO GROWTH 3 DAYS  Final   Report Status PENDING  Incomplete  Culture, blood (routine x 2)     Status: None (Preliminary result)   Collection Time: 09/14/15 11:30 PM  Result Value Ref Range Status   Specimen Description BLOOD LEFT ARM  Final   Special Requests BOTTLES DRAWN AEROBIC AND ANAEROBIC 10CC   Final   Culture NO GROWTH 3 DAYS  Final   Report Status PENDING  Incomplete  Urine culture     Status: None   Collection Time: 09/15/15 12:24 AM  Result Value Ref Range Status   Specimen Description URINE, CLEAN CATCH  Final   Special Requests NONE  Final   Culture NO GROWTH 1 DAY  Final   Report Status 09/16/2015 FINAL  Final     Studies:  Recent x-ray studies have been reviewed in detail by the Attending Physician  Scheduled Meds:  Scheduled Meds: . antiseptic oral rinse  7 mL Mouth Rinse q12n4p  . aspirin  81 mg Oral Daily  . budesonide (PULMICORT) nebulizer solution  0.25 mg Nebulization BID  . chlorhexidine  15 mL Mouth Rinse BID  .  dabigatran  150 mg Oral Q12H  . dextromethorphan-guaiFENesin  1 tablet Oral BID  . famotidine  20 mg Oral Daily  . furosemide  40 mg Oral Daily  . ipratropium  0.5 mg Nebulization QID  . isosorbide mononitrate  60 mg Oral Daily  . levalbuterol  0.63 mg Nebulization QID  . levofloxacin  500 mg Oral Daily  . levothyroxine  50 mcg Oral QAC breakfast  . methylPREDNISolone (SOLU-MEDROL) injection  60 mg Intravenous Q24H  . metoprolol succinate  150 mg Oral QHS  . metoprolol succinate  200 mg Oral Daily  . sodium chloride  3 mL Intravenous Q12H    Time spent on care of this patient: 40 mins   Alexys Lobello, Geraldo Docker , MD  [facility] Office  [phone] Pager [phone]  On-Call/Text Page:      [email or web address]      password [email or web address]  If 7PM-7AM, please contact night-coverage [email or web address] Password [email or web address] 09/18/2015, 8:02 PM   LOS: 4 days   Care during the described time interval was provided by me .  I have reviewed this patient's available data, including medical history, events of note, physical examination, and all test results as part of my evaluation. I have personally reviewed and interpreted all radiology studies.   Dia Crawford, MD [phone] Pager

## 2015-09-19 DIAGNOSIS — I5023 Acute on chronic systolic (congestive) heart failure: Secondary | ICD-10-CM

## 2015-09-19 DIAGNOSIS — I482 Chronic atrial fibrillation: Secondary | ICD-10-CM

## 2015-09-19 DIAGNOSIS — I25810 Atherosclerosis of coronary artery bypass graft(s) without angina pectoris: Secondary | ICD-10-CM

## 2015-09-19 MED ORDER — METOPROLOL TARTRATE 1 MG/ML IV SOLN
2.5000 mg | Freq: Once | INTRAVENOUS | Status: AC
  Administered 2015-09-19: 2.5 mg via INTRAVENOUS
  Filled 2015-09-19: qty 5

## 2015-09-19 MED ORDER — DILTIAZEM HCL 100 MG IV SOLR
5.0000 mg/h | INTRAVENOUS | Status: DC
  Administered 2015-09-19 (×2): 5 mg/h via INTRAVENOUS
  Filled 2015-09-19: qty 100

## 2015-09-19 MED ORDER — DILTIAZEM HCL ER COATED BEADS 360 MG PO CP24
360.0000 mg | ORAL_CAPSULE | Freq: Every day | ORAL | Status: DC
  Administered 2015-09-19 – 2015-09-21 (×3): 360 mg via ORAL
  Filled 2015-09-19 (×3): qty 1

## 2015-09-19 MED ORDER — ALUM & MAG HYDROXIDE-SIMETH 200-200-20 MG/5ML PO SUSP
30.0000 mL | Freq: Four times a day (QID) | ORAL | Status: DC | PRN
  Administered 2015-09-19: 30 mL via ORAL
  Filled 2015-09-19: qty 30

## 2015-09-19 MED ORDER — POTASSIUM CHLORIDE CRYS ER 20 MEQ PO TBCR
40.0000 meq | EXTENDED_RELEASE_TABLET | Freq: Once | ORAL | Status: AC
  Administered 2015-09-19: 40 meq via ORAL
  Filled 2015-09-19: qty 2

## 2015-09-19 MED ORDER — DM-GUAIFENESIN ER 30-600 MG PO TB12: 1.0000 | ORAL_TABLET | Freq: Two times a day (BID) | ORAL | Status: DC | PRN

## 2015-09-19 NOTE — Progress Notes (Signed)
Occupational Therapy Treatment Patient Details Name: Lindsey Mejia MRN: [id number] DOB: [birth year] Today's Date: 09/19/2015    History of present illness 69yo F with Hx of COPD on 2L oxygen at night, Afib with RVR on dabigitran, CAD s/p CABG in 1997 and PCI in 2013, PE in 2013, HTN, Hypothyroidism, and GERD who presented to [facility] with acute respiratory failure with O2 saturations in the 50s. She was started initially on NRB and then on Bipap. CXR showed apparent LLL infiltrate   OT comments  Pt progressing well. Completed functional activities with O2 Sats stable on RA. HR 95-120. Began education on energy conservation techniques. Will continue to follow to address established goals.   Follow Up Recommendations  Supervision - Intermittent;No OT follow up    Equipment Recommendations       Recommendations for Other Services      Precautions / Restrictions Precautions Precautions: Other (comment) Precaution Comments: watch HR       Mobility Bed Mobility                  Transfers Overall transfer level: Modified independent                    Balance Overall balance assessment: No apparent balance deficits (not formally assessed)                                 ADL                           Toilet Transfer: Supervision/safety;Regular Toilet;Ambulation   Toileting- Clothing Manipulation and Hygiene: Supervision/safety;Sit to/from stand         General ADL Comments: Began education on energy conservation. Ambulated around 180 ft with stable O2 Sats. Afib.      Vision                     Perception     Praxis      Cognition   Behavior During Therapy: WFL for tasks assessed/performed Overall Cognitive Status: Within Functional Limits for tasks assessed                       Extremity/Trunk Assessment               Exercises     Shoulder Instructions       General Comments       Pertinent Vitals/ Pain       Pain Assessment: No/denies pain  Home Living                                          Prior Functioning/Environment              Frequency Min 2X/week     Progress Toward Goals  OT Goals(current goals can now be found in the care plan section)  Progress towards OT goals: Progressing toward goals  Acute Rehab OT Goals Patient Stated Goal: to go home OT Goal Formulation: With patient Time For Goal Achievement: 10/01/15 Potential to Achieve Goals: Good ADL Goals Pt Will Transfer to Toilet: with modified independence;ambulating Pt Will Perform Toileting - Clothing Manipulation and hygiene: with modified independence;sit to/from stand Additional ADL Goal #1: Pt will verbalized understanding of 3 energy conservation  techniques for ADL  Plan Discharge plan remains appropriate    Co-evaluation                 End of Session Equipment Utilized During Treatment: Gait belt   Activity Tolerance Patient tolerated treatment well   Patient Left in chair;with call bell/phone within reach   Nurse Communication Mobility status        Time: 2094-7096 OT Time Calculation (min): 18 min  Charges: OT General Charges $OT Visit: 1 Procedure OT Treatments $Self Care/Home Management : 8-22 mins  Shimshon Narula,HILLARY 09/19/2015, 5:15 PM   [facility], OTR/L  [phone] 09/19/2015

## 2015-09-19 NOTE — Care Management Important Message (Signed)
Important Message  Patient Details  Name: Lindsey Mejia MRN: [id number] Date of Birth: 1946-11-29   Medicare Important Message Given:  Yes-second notification given    Delorse Lek 09/19/2015, 11:19 AM

## 2015-09-19 NOTE — Progress Notes (Signed)
Physical Therapy Treatment Patient Details Name: Lindsey Mejia MRN: [id number] DOB: 17-Dec-1945 Today's Date: 09/19/2015    History of Present Illness 69yo F with Hx of COPD on 2L oxygen at night, Afib with RVR on dabigitran, CAD s/p CABG in 1997 and PCI in 2013, PE in 2013, HTN, Hypothyroidism, and GERD who presented to [facility] with acute respiratory failure with O2 saturations in the 50s. She was started initially on NRB and then on Bipap. CXR showed apparent LLL infiltrate    PT Comments    Pt admitted with above diagnosis. Pt currently with functional limitations due to balance and endurance deficits. Pt able to ambulate without LOB and does not desat.  Steady gait overall.  Progressing well.   Pt will benefit from skilled PT to increase their independence and safety with mobility to allow discharge to the venue listed below.    Follow Up Recommendations  No PT follow up;Supervision - Intermittent     Equipment Recommendations  None recommended by PT    Recommendations for Other Services       Precautions / Restrictions Precautions Precautions: Other (comment) Precaution Comments: watch HR Restrictions Weight Bearing Restrictions: No    Mobility  Bed Mobility Overal bed mobility: Modified Independent                Transfers Overall transfer level: Modified independent                  Ambulation/Gait Ambulation/Gait assistance: Min guard Ambulation Distance (Feet): 350 Feet Assistive device: None Gait Pattern/deviations: Step-through pattern;Decreased stride length;Drifts right/left   Gait velocity interpretation: Below normal speed for age/gender General Gait Details: Overall ambulates well.  No LOB however no challenges to balance either.  O2 sats >90% on RA with ambulation.    Stairs            Wheelchair Mobility    Modified Rankin (Stroke Patients Only)       Balance Overall balance assessment: No apparent balance  deficits (not formally assessed)                                  Cognition Arousal/Alertness: Awake/alert Behavior During Therapy: WFL for tasks assessed/performed Overall Cognitive Status: Within Functional Limits for tasks assessed                      Exercises      General Comments        Pertinent Vitals/Pain Pain Assessment: No/denies pain  HR 106-138 bpm, Sats >90% on RA.    Home Living                      Prior Function            PT Goals (current goals can now be found in the care plan section) Progress towards PT goals: Progressing toward goals    Frequency  Min 3X/week    PT Plan Current plan remains appropriate    Co-evaluation             End of Session Equipment Utilized During Treatment: Gait belt;Oxygen Activity Tolerance: Patient limited by fatigue Patient left: in chair;with call bell/phone within reach     Time: 1032-1042 PT Time Calculation (min) (ACUTE ONLY): 10 min  Charges:  $Gait Training: 8-22 mins  G CodesDenice Paradise 09/19/2015, 12:05 PM [employer] Acute Rehabilitation [phone] (pager)

## 2015-09-19 NOTE — Consult Note (Signed)
Patient ID: Lindsey Mejia MRN: [id number], DOB/AGE: Nov 16, 1946   Admit date: 09/14/2015   Primary Physician: Azzie Almas, MD Primary Cardiologist: Dr. Cloyd Stagers [facility] in [facility])  Pt. Profile:  69 y/o female followed by [facility], with h/o CAD, chronic atrial fibrillation, CHF, tobacco abuse and COPD, admitted for acute hypoxic respiratory failure in the setting of PNA, acute COPD exacerbation and CHF. Hospital course further complicated by Afib w/ RVR.   Problem List  Past Medical History  Diagnosis Date  . COPD (chronic obstructive pulmonary disease) ([place])     On home O2  . Pulmonary embolism ([place])   . HTN (hypertension)   . Hypothyroidism   . GERD (gastroesophageal reflux disease)   . HLD (hyperlipidemia)   . CAD (coronary artery disease)   . CHF (congestive heart failure) (HCC)     Unknown classification  . Arthritis   . Esophageal cancer ([place])   . Atrial fibrillation ([place])   . CKD (chronic kidney disease) stage 3, GFR 30-59 ml/min     Past Surgical History  Procedure Laterality Date  . Cardiac stents      DES to SVG  . Eye surgery      bilateral cataracts  . Upper gi endoscopy    . Coronary artery bypass graft       Allergies  Allergies  Allergen Reactions  . Propoxyphene Nausea And Vomiting    HPI  69 y/o female with extensive cardiac history, followed by Dr. Cloyd Stagers of [facility] in [place]. She has been admitted to [facility] by IM for treatment of acute on chronic respiratory failure in the setting of likely COPD exacerbation, ? HCAP and acute CHF. She was hypoxic on arrival with O2 sats in the 50s. She was initially started on a NRB but then transitioned to BIPAP. CXR showed a LLL infiltrate.  BNP elevated at 11K. IV lasix given but now on PO. Blood cultures pending and empiric antibiotics initiated. [facility] has been consulted to assist with afib management. Earlier today, she went into a rapid ventricular response. She has been started on  a Cardizem drip and her rate has improved.   Per records in [place], she recently saw Dr. Cloyd Stagers 08/23/15. Per office note, she has a h/o CAD s/p CABG x3 (1997) and PCI (2013), CHF, HTN, HLD, chronic atrial fibrillation, h/o PEs (2013), CKD stage 3, remote h/o tobacco abuse. She had a stress test 03/2014 that was low risk. She has known diastolic dysfunction with normal EF of 55-60% by echo 03/2015. Given her afib is chronic, she has been treated with rate control strategy with metoprolol and diltiazem. Her CHA2DS2 VASc score is 5. She is on Pradaxa for a/c. Additional heart medications include ASA, atorvastatin and Imdur.     Home Medications  Prior to Admission medications   Medication Sig Start Date End Date Taking? Authorizing Provider  aspirin 81 MG chewable tablet Chew 81 mg by mouth daily.   Yes Historical Provider, MD  atorvastatin (LIPITOR) 40 MG tablet Take 40 mg by mouth daily.   Yes Historical Provider, MD  beclomethasone (QVAR) 80 MCG/ACT inhaler Inhale 1 puff into the lungs 2 (two) times daily.   Yes Historical Provider, MD  cetirizine (ZYRTEC) 10 MG chewable tablet Chew 10 mg by mouth daily.   Yes Historical Provider, MD  dabigatran (PRADAXA) 150 MG CAPS capsule Take 150 mg by mouth 2 (two) times daily.   Yes Historical Provider, MD  diazepam (VALIUM)  10 MG tablet Take 10 mg by mouth every 6 (six) hours as needed for anxiety.   Yes Historical Provider, MD  diltiazem (TIAZAC) 360 MG 24 hr capsule Take 360 mg by mouth daily.   Yes Historical Provider, MD  famotidine (PEPCID) 20 MG tablet Take 20 mg by mouth daily.   Yes Historical Provider, MD  furosemide (LASIX) 40 MG tablet Take 40 mg by mouth daily.   Yes Historical Provider, MD  isosorbide mononitrate (IMDUR) 60 MG 24 hr tablet Take 60 mg by mouth daily.   Yes Historical Provider, MD  levothyroxine (SYNTHROID, LEVOTHROID) 50 MCG tablet Take 50 mcg by mouth daily before breakfast.   Yes Historical Provider, MD  magnesium oxide  (MAG-OX) 400 MG tablet Take 400 mg by mouth daily.   Yes Historical Provider, MD  metoprolol succinate (TOPROL-XL) 100 MG 24 hr tablet Take 100-200 mg by mouth 2 (two) times daily. Pt takes 200mg  in the morning and 100mg  in the evening.Take with or immediately following a meal.   Yes Historical Provider, MD  nitroGLYCERIN (NITROSTAT) 0.4 MG SL tablet Place 0.4 mg under the tongue every 5 (five) minutes as needed for chest pain.   Yes Historical Provider, MD  oxyCODONE-acetaminophen (PERCOCET) 10-325 MG tablet Take 1 tablet by mouth every 4 (four) hours as needed for pain.   Yes Historical Provider, MD  tiotropium (SPIRIVA) 18 MCG inhalation capsule Place 18 mcg into inhaler and inhale daily.   Yes Historical Provider, MD    Family History  Family History  Problem Relation Age of Onset  . Arthritis Mother   . Cancer Sister   . Heart disease Brother   . Liver disease Brother     Social History  Social History   Social History  . Marital Status: Married    Spouse Name: N/A  . Number of Children: N/A  . Years of Education: N/A   Occupational History  . Not on file.   Social History Main Topics  . Smoking status: Former Smoker    Quit date: 03/20/2006  . Smokeless tobacco: Not on file  . Alcohol Use: No  . Drug Use: No  . Sexual Activity: No   Other Topics Concern  . Not on file   Social History Narrative     Review of Systems General:  No chills, fever, night sweats or weight changes.  Cardiovascular:  No chest pain, dyspnea on exertion, edema, orthopnea, palpitations, paroxysmal nocturnal dyspnea. Dermatological: No rash, lesions/masses Respiratory: No cough, dyspnea Urologic: No hematuria, dysuria Abdominal:   No nausea, vomiting, diarrhea, bright red blood per rectum, melena, or hematemesis Neurologic:  No visual changes, wkns, changes in mental status. All other systems reviewed and are otherwise negative except as noted above.  Physical Exam  Blood pressure  130/55, pulse 92, temperature 97.6 F (36.4 C), temperature source Oral, resp. rate 24, height 5' (1.524 m), weight 117 lb 9.6 oz (53.343 kg), SpO2 95 %.  General: Pleasant, NAD, breathing unlabored Psych: Normal affect. Neuro: Alert and oriented X 3. Moves all extremities spontaneously. HEENT: Normal  Neck: Supple without bruits or JVD. Lungs:  Resp regular and unlabored, decreased BS at the bases bilaterally. Rhonchi in the LLL field. No wheezes or rales.  Heart: irregularly irregular, regular rate, no s3, s4, or murmurs. Abdomen: Soft, non-tender, non-distended, BS + x 4.  Extremities: No clubbing, cyanosis or edema. DP/PT/Radials 2+ and equal bilaterally.  Labs  Troponin (Point of Care Test) No results for input(s): TROPIPOC in  the last 72 hours.  Recent Labs  09/17/15 0233  TROPONINI 0.07*   Lab Results  Component Value Date   WBC 13.5* 09/18/2015   HGB 12.5 09/18/2015   HCT 40.8 09/18/2015   MCV 92.3 09/18/2015   PLT 223 09/18/2015    Recent Labs Lab 09/16/15 0240  09/18/15 0222  NA 134*  < > 139  K 3.9  < > 3.5  CL 90*  < > 97*  CO2 34*  < > 32  BUN 31*  < > 31*  CREATININE 1.07*  < > 0.93  CALCIUM 8.7*  < > 8.9  PROT 5.7*  --   --   BILITOT 0.4  --   --   ALKPHOS 63  --   --   ALT 66*  --   --   AST 31  --   --   GLUCOSE 168*  < > 107*  < > = values in this interval not displayed. No results found for: CHOL, HDL, LDLCALC, TRIG No results found for: DDIMER   Radiology/Studies  Dg Chest Port 1 View  09/16/2015  CLINICAL DATA:  Pneumonia. Esophageal cancer. Coronary artery disease with CHF. EXAM: PORTABLE CHEST 1 VIEW COMPARISON:  1 day prior FINDINGS: Prior median sternotomy. Midline trachea. Mild cardiomegaly. Atherosclerosis in the transverse aorta. Small layering right pleural effusion persists. No pneumothorax. Interstitial edema is asymmetric, greater on the right. Slightly increased. Increased right and developing left base airspace disease.  IMPRESSION: Slight worsening aeration, with increased congestive heart failure and layering right pleural effusion. Progressive right worse than left bibasilar atelectasis versus concurrent infection. Electronically Signed   By: Abigail Miyamoto M.D.   On: 09/16/2015 08:07   Dg Chest Port 1 View  09/15/2015  CLINICAL DATA:  Acute respiratory failure. EXAM: PORTABLE CHEST 1 VIEW COMPARISON:  None. FINDINGS: Prior median sternotomy. Midline trachea. Cardiomegaly accentuated by AP portable technique. Right pleural effusion is small. No pneumothorax. Mild interstitial edema. Right base airspace disease. IMPRESSION: Mild congestive heart failure. Small right pleural effusion with adjacent atelectasis versus infection. Atherosclerosis Electronically Signed   By: Abigail Miyamoto M.D.   On: 09/15/2015 09:35    ECG  Chronic atrial fibrillation   Echocardiogram 09/18/15  Study Conclusions  - Left ventricle: The cavity size was normal. Systolic function was mildly to moderately reduced. The estimated ejection fraction was in the range of 40% to 45%. Diffuse hypokinesis. - Aortic valve: Trileaflet; moderately thickened, moderately calcified leaflets. Valve mobility was restricted. There was mild stenosis. There was trivial regurgitation. Mean gradient (S): 16 mm Hg. Peak gradient (S): 27 mm Hg. Valve area (VTI): 0.8 cm^2. Valve area (Vmax): 0.82 cm^2. Valve area (Vmean): 0.82 cm^2. - Aortic root: The aortic root was normal in size. - Ascending aorta: The ascending aorta was not visualized. - Mitral valve: Calcified annulus. Mildly thickened leaflets . There was mild regurgitation directed centrally. - Right ventricle: The cavity size was moderately dilated. Wall thickness was normal. Systolic function was moderately to severely reduced. - Right atrium: The atrium was moderately dilated. - Tricuspid valve: There was moderate-severe regurgitation. - Pulmonic valve: There was mild  regurgitation. - Pulmonary arteries: Systolic pressure was mildly increased. PA peak pressure: 43 mm Hg (S). - Inferior vena cava: The vessel was normal in size. - Pericardium, extracardiac: There was no pericardial effusion.    ASSESSMENT AND PLAN  Active Problems:   Acute on chronic respiratory failure (HCC)   COPD exacerbation (HCC)   HTN (hypertension)  Hypothyroidism   CAD (coronary artery disease) of artery bypass graft   CHF (congestive heart failure) (HCC)   A-fib (HCC)   Esophageal cancer (HCC)   CKD (chronic kidney disease) stage 3, GFR 30-59 ml/min   Acute respiratory failure (HCC)   Atrial fibrillation with RVR (HCC)   Acute on chronic respiratory failure with hypoxia and hypercapnia (HCC)   Sepsis due to pneumonia (HCC)   Acute renal insufficiency   S/P CABG x 3    1. Atrial Fibrillation w/ RVR: per records, she has a h/o chronic atrial fibrillation which has been managed with rate control therapy with metoprolol and Cardizem. She has been on Pradaxa given a CHA2DS2 VASc score of 5. Her atrial fibrillation is likely exacerbated by her acute medical illnesses, including likely LLL PNA and acute COPD exacerbation. Agree with IV Cardizem for rate control and continue treatment of underlying illnesses. Continue Pradaxa for anticoagulation given her high stroke risk. Continue PO metoprolol for rate control. If additional rate control is needed, can consider adding digoxin given LV dysfunction. She has been on PO Cardizem as an OP, however recent echo shows reduction in EF down to 40-45% (55-60% 03/2015 per Dr. Charlsie Merles note). Can defer continuation of long term Cardizem use to Dr. Cloyd Stagers.   2. Newly Reduced LV Systolic Function: 2D echo shows reduction in EF down to 40-45% (55-60% 03/2015). Recommend treating acute medical issues and allow patient to f/u with her primary cardiologist, Dr. Cloyd Stagers, in [facility]. He can determine if she should undergo repeat NST/ additional ischemic  w/u once she recovers.   3. Combined Systolic + Diastolic CHF: continue treatment with Lasix. Continue BB. Low sodium diet.   4. Acute Hypoxic Respiratory Failure: Multifactorial. In the setting of acute PNA, COPD exacerbation and acute CHF.   5. PNA: on antibiotics. Management per IM.  6. COPD: on antibiotics and supportive care with inhalers. Agree with Xopenex vs albuterol to prevent reflex tachycardia.   7. CAD: s/p CABG x3 (1997) and PCI (2013). Note she had 1 episode of mild chest pain 2 night ago, relieved with SL NTG, but denies any recurrence. Currently CP free.    Signed, Lyda Jester, PA-C 09/19/2015, 12:00 PM   Attending Note:   The patient was seen and examined.  Agree with assessment and plan as noted above.  Changes made to the above note as needed.  1. Atrial fib:  Chronic  Rate is rapid because of her underlying pulmonary / COPD issues.  Would restart her home dose of Cardizem and then titreate the Cardizem drip  Will start PO cardizem and then let the nurse know of the plan to titrate the drip as tolerted. / needed.   Could also consider adding short term amiodarone - would need to be careful about long term amio because of her lung disease.  Could also add low dose Digoxin if we need additional HR slowing .   2. CAD :  Stable.    Thayer Headings, Brooke Bonito., MD, [place] 09/19/2015, 1:30 PM [address],  [place] Pager [phone]

## 2015-09-19 NOTE — Progress Notes (Signed)
Home TEAM 1 - Stepdown/ICU TEAM PROGRESS NOTE  Lindsey Mejia JSE:[id number] DOB: 1945-12-19 DOA: 09/14/2015 PCP: Azzie Almas, MD  Admit HPI / Brief Narrative: 69yo F with Hx of COPD on 2L oxygen at night, Afib with RVR on dabigitran, CAD s/p CABG in 1997 and PCI in 2013, PE in 2013, HTN, Hypothyroidism, and GERD who presented to [facility] with acute respiratory failure with O2 saturations in the 50s. She was started initially on NRB and then on Bipap.  CXR showed apparent LLL infiltrate. She had a BNP of 11,500. She was initially given IV lasix and nitropatch, however, her blood pressure could not tolerate the nitropatch and this was discontinued. BC were ordered, sputum culture unable to be obtained. She was given Levaquin and Vancomycin in the ED along with solumedrol 125mg . Foley was placed. She was given metoprolol 5mg  IV for her elevated HR.   ABG on BIPAP showed 7.34/63/119, Lactate was 3.5, trended down to 1.7. Antibiotics were chosen given recent admission to [facility] in August of 2016 (2 months). CBC was 19, Cr 1.2. TnI initially negative.   HPI/Subjective: The patient's respiratory status has improved significantly, but she continues to have difficulty w/ RVR, requiring a return to cardizem gtt again this morning.  She denies chest pain shortness of breath nausea vomiting or abdominal pain at the present time.  Assessment/Plan:  Chronic A-fib with acute RVR Rate has been poorly controlled over last 24 hours and was greatly exacerbated with physical exertion - CHA2DS2-VASc 4+ - cont Pradaxa - resumed home BB - changed to xopenex neb - w/ need to return to IV cardizem again today, I have asked Cards to evaluate her to determine if antiarrythmic tx is indicated  Acute on chronic hypoxic and hypercarbic respiratory failure - multifactorial (see below) Resolved with patient successfully weaned to room air  COPD exacerbation patient confirms she has not smoked in  years - now resolved   Sepsis due to LLL HCAP At presentation WBC 14.7 + HR 115 = sepsis - cont empiric abx coverage for 7 days total - hemodynamically stable    ?Acute CHF exacerbation TTE at [facility] April 2016 noted EF 55-60% but evidence of DD and mild AoS - f/u TTE notes decreased EF at 40-45% w/ diffuse hypokinesis which is likely rate related - CXR 10/16 noted increased pulm edema/effusion but clinically no evidence of significant volume overload - resumed home lasix dose and pt currently w/o evidence of volume overload    Acute renal insufficiency - NO CKD crt at [facility] in August NORMAL at 0.63 - renal fxn has not yet returned to normal, but is improving and now very close to baseline   Hx of PE  Cont pradaxa  HTN BP control not ideal - follow w/ return to IV cardizem   Hypothyroidism TSH July 1.15 - cont home dose synthroid   CAD s/p CABG - very mildly elevated troponin Denies cp - trop not significantly elevated initially but then increased to 0.16 - recheck troponin improved - Stress test 03/2014 SPECT at [facility]: "abnormal probably low risk study" - no further inpt w/u indicated at this time    Hx esophageal CA  Code Status: FULL Family Communication: no family present at time of exam Disposition Plan: SDU  Consultants: [facility]   Procedures: TTE 10/18 - EF 40-45% - diffuse hypokinesis - mild AoS - mod reduced RV systolic fxn - mod/severe tricuspid regurg - PA pressure 39mm Hg  Antibiotics: Levaquin 10/14 >  Vanc 10/14 > 10/16  DVT prophylaxis: pradaxa  Objective: Blood pressure 157/112, pulse 123, temperature 97.4 F (36.3 C), temperature source Oral, resp. rate 16, height 5' (1.524 m), weight 53.343 kg (117 lb 9.6 oz), SpO2 96 %.  Intake/Output Summary (Last 24 hours) at 09/19/15 1012 Last data filed at 09/19/15 0900  Gross per 24 hour  Intake 756.25 ml  Output   3000 ml  Net -2243.75 ml   Exam: General: alert and oriented - no acute resp distress Lungs:  no wheezing - cta th/o  Cardiovascular: tachycardic at ~130 bpm - irreg irreg - no JVD Abdomen: Nontender, nondistended, soft, bowel sounds positive, no rebound, no ascites, no appreciable mass Extremities: No significant cyanosis, clubbing, edema bilateral lower extremities  Data Reviewed: Basic Metabolic Panel:  Recent Labs Lab 09/15/15 0345 09/16/15 0240 09/17/15 0243 09/18/15 0222  NA 133* 134* 136 139  K 4.6 3.9 4.5 3.5  CL 91* 90* 92* 97*  CO2 31 34* 35* 32  GLUCOSE 115* 168* 146* 107*  BUN 27* 31* 26* 31*  CREATININE 1.07* 1.07* 1.06* 0.93  CALCIUM 8.6* 8.7* 8.8* 8.9  MG  --   --   --  2.0    CBC:  Recent Labs Lab 09/15/15 0345 09/16/15 0240 09/17/15 0243 09/18/15 0222  WBC 14.7* 13.3* 16.5* 13.5*  HGB 12.3 12.0 12.6 12.5  HCT 40.8 39.2 41.3 40.8  MCV 93.4 92.0 92.6 92.3  PLT 155 193 220 223    Liver Function Tests:  Recent Labs Lab 09/15/15 0345 09/16/15 0240  AST 49* 31  ALT 92* 66*  ALKPHOS 64 63  BILITOT 1.2 0.4  PROT 6.0* 5.7*  ALBUMIN 3.0* 2.9*   Cardiac Enzymes:  Recent Labs Lab 09/14/15 2256 09/15/15 0345 09/15/15 0955 09/16/15 1054 09/17/15 0233  TROPONINI 0.04* 0.04* 0.16* 0.05* 0.07*    Recent Results (from the past 240 hour(s))  MRSA PCR Screening     Status: None   Collection Time: 09/14/15  6:28 PM  Result Value Ref Range Status   MRSA by PCR NEGATIVE NEGATIVE Final    Comment:        The GeneXpert MRSA Assay (FDA approved for NASAL specimens only), is one component of a comprehensive MRSA colonization surveillance program. It is not intended to diagnose MRSA infection nor to guide or monitor treatment for MRSA infections.   Culture, blood (routine x 2)     Status: None (Preliminary result)   Collection Time: 09/14/15 11:20 PM  Result Value Ref Range Status   Specimen Description BLOOD RIGHT ARM  Final   Special Requests BOTTLES DRAWN AEROBIC AND ANAEROBIC 10CC   Final   Culture NO GROWTH 3 DAYS  Final    Report Status PENDING  Incomplete  Culture, blood (routine x 2)     Status: None (Preliminary result)   Collection Time: 09/14/15 11:30 PM  Result Value Ref Range Status   Specimen Description BLOOD LEFT ARM  Final   Special Requests BOTTLES DRAWN AEROBIC AND ANAEROBIC 10CC   Final   Culture NO GROWTH 3 DAYS  Final   Report Status PENDING  Incomplete  Urine culture     Status: None   Collection Time: 09/15/15 12:24 AM  Result Value Ref Range Status   Specimen Description URINE, CLEAN CATCH  Final   Special Requests NONE  Final   Culture NO GROWTH 1 DAY  Final   Report Status 09/16/2015 FINAL  Final     Studies:   Recent  x-ray studies have been reviewed in detail by the Attending Physician  Scheduled Meds:  Scheduled Meds: . antiseptic oral rinse  7 mL Mouth Rinse q12n4p  . aspirin  81 mg Oral Daily  . budesonide (PULMICORT) nebulizer solution  0.25 mg Nebulization BID  . chlorhexidine  15 mL Mouth Rinse BID  . dabigatran  150 mg Oral Q12H  . dextromethorphan-guaiFENesin  1 tablet Oral BID  . famotidine  20 mg Oral Daily  . furosemide  40 mg Oral Daily  . ipratropium  0.5 mg Nebulization QID  . isosorbide mononitrate  60 mg Oral Daily  . levalbuterol  0.63 mg Nebulization QID  . levofloxacin  500 mg Oral Daily  . levothyroxine  50 mcg Oral QAC breakfast  . methylPREDNISolone (SOLU-MEDROL) injection  60 mg Intravenous Q24H  . metoprolol succinate  150 mg Oral QHS  . metoprolol succinate  200 mg Oral Daily  . sodium chloride  3 mL Intravenous Q12H    Time spent on care of this patient: 35 mins   Jenisa Monty T , MD   [facility] Office  [phone] Pager - Text Page per Shea Evans as per below:  On-Call/Text Page:      [email or web address]      password [email or web address]  If 7PM-7AM, please contact night-coverage [email or web address] Password [email or web address] 09/19/2015, 10:12 AM   LOS: 5 days

## 2015-09-19 NOTE — Progress Notes (Signed)
Ambulated along the hallway with PT. with pulse ox ->90% on room air. No distress noted.

## 2015-09-20 DIAGNOSIS — I272 Other secondary pulmonary hypertension: Secondary | ICD-10-CM

## 2015-09-20 DIAGNOSIS — I2510 Atherosclerotic heart disease of native coronary artery without angina pectoris: Secondary | ICD-10-CM

## 2015-09-20 DIAGNOSIS — I10 Essential (primary) hypertension: Secondary | ICD-10-CM

## 2015-09-20 LAB — CULTURE, BLOOD (ROUTINE X 2)
Culture: NO GROWTH
Culture: NO GROWTH

## 2015-09-20 LAB — BASIC METABOLIC PANEL
Anion gap: 11 (ref 5–15)
BUN: 28 mg/dL — AB (ref 6–20)
CHLORIDE: 100 mmol/L — AB (ref 101–111)
CO2: 29 mmol/L (ref 22–32)
CREATININE: 0.98 mg/dL (ref 0.44–1.00)
Calcium: 9.2 mg/dL (ref 8.9–10.3)
GFR calc Af Amer: >60 mL/min (ref >60)
GFR calc non Af Amer: 58 mL/min — ABNORMAL LOW (ref >60)
GLUCOSE: 112 mg/dL — AB (ref 65–99)
Potassium: 3.9 mmol/L (ref 3.5–5.1)
Sodium: 140 mmol/L (ref 135–145)

## 2015-09-20 LAB — CBC
HEMATOCRIT: 46 % (ref 36.0–46.0)
Hemoglobin: 14.3 g/dL (ref 12.0–15.0)
MCH: 28.5 pg (ref 26.0–34.0)
MCHC: 31.1 g/dL (ref 30.0–36.0)
MCV: 91.8 fL (ref 78.0–100.0)
PLATELETS: 282 10*3/uL (ref 150–400)
RBC: 5.01 MIL/uL (ref 3.87–5.11)
RDW: 17.6 % — AB (ref 11.5–15.5)
WBC: 18.5 10*3/uL — ABNORMAL HIGH (ref 4.0–10.5)

## 2015-09-20 LAB — MAGNESIUM: Magnesium: 2.2 mg/dL (ref 1.7–2.4)

## 2015-09-20 MED ORDER — IPRATROPIUM BROMIDE 0.02 % IN SOLN: 0.5000 mg | Freq: Four times a day (QID) | RESPIRATORY_TRACT | Status: DC | PRN

## 2015-09-20 MED ORDER — LEVALBUTEROL HCL 0.63 MG/3ML IN NEBU: 0.6300 mg | INHALATION_SOLUTION | Freq: Four times a day (QID) | RESPIRATORY_TRACT | Status: DC | PRN

## 2015-09-20 NOTE — Progress Notes (Signed)
Patient ambulated in hall. O2 sats remained 95 and above for the walk. No complaints of shortness of breath. Patient tolerated well.

## 2015-09-20 NOTE — Progress Notes (Addendum)
[place] TEAM 1 - Stepdown/ICU TEAM Progress Note  Lindsey Mejia SWF:[id number] DOB: 03/24/1946 DOA: 09/14/2015 PCP: Azzie Almas, MD  Admit HPI / Brief Narrative: 22GU RK with PMHx of COPD on 2L oxygen at night, Afib with RVR on Dabigitran, CAD s/p CABG in 1997 and PCI in 2013, PE in 2013, HTN, Hypothyroidism, and GERD   Presented to [facility] with acute respiratory failure with O2 saturations in the 50s. She was started initially on NRB and then on Bipap. CXR showed apparent LLL infiltrate. She had a BNP of 11,500. She was initially given IV lasix and nitropatch, however, her blood pressure could not tolerate the nitropatch and this was discontinued. BC were ordered, sputum culture unable to be obtained. She was given Levaquin and Vancomycin in the ED along with solumedrol 125mg . Foley was placed. She was given metoprolol 5mg  IV for her elevated HR.   ABG on BIPAP showed 7.34/63/119, Lactate was 3.5, trended down to 1.7. Antibiotics were chosen given recent admission to [facility] in August of 2016 (2 months). CBC was 19, Cr 1.2. TnI initially negative.    HPI/Subjective: 10/20 A/O 4, patient states is on 2 L O2 via [place] at night, during the day negative O2 requirement. Currently sitting in chair comfortably. States ambulated around ward yesterday without increased O2 demand.    Assessment/Plan: Chronic A-fib with acute RVR -Rate has been better controlled over last 24 hours, however patient has not exerted herself. - CHA2DS2-VASc 4+ - cont Pradaxa  - resumed home BB yesterday - -ambulate q shift; patient's husband died will attempt to get patient home ASAP  Acute on chronic hypoxic and hypercarbic respiratory failure - multifactorial (see below) -Continues to be SOB may have to start O2 during times of exertion+ at bedtime -Ambulatory SPO2  COPD exacerbation -Continue current antibiotics for 7 days  -Xopenex QID -Flutter valve -Solu-Medrol 60 mg daily -Mucinex  DM BID -Physiotherapy vest  OSA? -States was told at one time that she had OSA but never obtained sleep study.  -Will need to obtain sleep study, 6-8 weeks post discharge   Sepsis due to LLL HCAP -At presentation WBC 14.7 + HR 115 = sepsis  - cont empiric abx coverage for 7 days of treatment    Chronic Systolic CHF (S/P CABG 3 vessel) TTE at [facility] April 2016 noted EF 55-60% but evidence of DD and mild AoS -Echocardiogram; shows worsening systolic CHF and pulmonary hypertension see results below  - CXR 10/16 noted increased pulm edema/effusion -Lasix 40 mg daily -Imdur 60 mg daily -Metoprolol XL 150 mg QHS -Metoprolol XL 200 mg daily -Unknown dry weight -Daily weights on standing scale; 10/20 weight= 52.1 kg -Strict in and out into admission -5.6 L  Pulmonary hypertension -See chronic systolic CHF  Acute renal insufficiency - NO CKD -Cr at [facility] in August NORMAL at 0.63 - renal fxn has not yet returned to normal, but is stable - resumed lasix - follow   Hx of PE  Cont pradaxa  HTN -See acute CHF    Hypothyroidism TSH July 1.15 - cont home dose synthroid   CAD s/p CABG 3 vessel - very mildly elevated troponin Denies cp - trop not significantly elevated initially but then increased to 0.16 - recheck troponin improved - Stress test 03/2014 SPECT at [facility]: "abnormal probably low risk study"  Hx esophageal CA    Code Status: FULL Family Communication: no family present at time of exam Disposition Plan: DC in 24-48 hours; needs to  attend husband's funeral    Consultants: NA  Procedure/Significant Events: 10/18 Echocardiogram; LVEF=40% to 45%. Diffuse hypokinesis. - Right ventricle: moderately dilated. - Right atrium: moderately dilated. - Tricuspid valve: moderate-severe regurgitation. - Pulmonary arteries: PApeak pressure: 43 mm Hg (S).   Culture   Antibiotics: Levaquin 10/14 > Vanc 10/14 > 10/16  DVT prophylaxis:     Devices NA   LINES / TUBES:    NA    Continuous Infusions:    Objective: VITAL SIGNS: Temp: 97.5 F (36.4 C) (10/21 0731) Temp Source: Oral (10/21 0731) BP: 144/88 mmHg (10/21 0731) Pulse Rate: 78 (10/21 0731) SPO2; FIO2:   Intake/Output Summary (Last 24 hours) at 09/21/15 0820 Last data filed at 09/21/15 0450  Gross per 24 hour  Intake    750 ml  Output   2150 ml  Net  -1400 ml     Exam: General: A/O 4, positive acute on chronic respiratory distress Eyes: Negative headache, eye pain, double vision,negative scleral hemorrhage ENT: Negative Runny nose, negative ear pain, negative gingival bleeding, Neck:  Negative scars, masses, torticollis, lymphadenopathy, JVD Lungs: positive diffuse rhonchi Lt>>Rt , positive diffuse expiratory wheezing, negative crackles  Cardiovascular: Regular rate and rhythm without murmur gallop or rub normal S1 and S2 Abdomen:negative abdominal pain, nondistended, positive soft, bowel sounds, no rebound, no ascites, no appreciable mass Extremities: No significant cyanosis, clubbing, or edema bilateral lower extremities Psychiatric:  Negative depression, negative anxiety, negative fatigue, negative mania Neurologic:  Cranial nerves II through XII intact, tongue/uvula midline, all extremities muscle strength 5/5, sensation intact throughout, negative dysarthria, negative expressive aphasia, negative receptive aphasia.   Data Reviewed: Basic Metabolic Panel:  Recent Labs Lab 09/15/15 0345 09/16/15 0240 09/17/15 0243 09/18/15 0222 09/20/15 0230  NA 133* 134* 136 139 140  K 4.6 3.9 4.5 3.5 3.9  CL 91* 90* 92* 97* 100*  CO2 31 34* 35* 32 29  GLUCOSE 115* 168* 146* 107* 112*  BUN 27* 31* 26* 31* 28*  CREATININE 1.07* 1.07* 1.06* 0.93 0.98  CALCIUM 8.6* 8.7* 8.8* 8.9 9.2  MG  --   --   --  2.0 2.2   Liver Function Tests:  Recent Labs Lab 09/15/15 0345 09/16/15 0240  AST 49* 31  ALT 92* 66*  ALKPHOS 64 63  BILITOT 1.2 0.4  PROT 6.0* 5.7*  ALBUMIN 3.0* 2.9*    No results for input(s): LIPASE, AMYLASE in the last 168 hours. No results for input(s): AMMONIA in the last 168 hours. CBC:  Recent Labs Lab 09/15/15 0345 09/16/15 0240 09/17/15 0243 09/18/15 0222 09/20/15 0230  WBC 14.7* 13.3* 16.5* 13.5* 18.5*  HGB 12.3 12.0 12.6 12.5 14.3  HCT 40.8 39.2 41.3 40.8 46.0  MCV 93.4 92.0 92.6 92.3 91.8  PLT 155 193 220 223 282   Cardiac Enzymes:  Recent Labs Lab 09/14/15 2256 09/15/15 0345 09/15/15 0955 09/16/15 1054 09/17/15 0233  TROPONINI 0.04* 0.04* 0.16* 0.05* 0.07*   BNP (last 3 results)  Recent Labs  09/15/15 0345  BNP 694.0*    ProBNP (last 3 results) No results for input(s): PROBNP in the last 8760 hours.  CBG: No results for input(s): GLUCAP in the last 168 hours.  Recent Results (from the past 240 hour(s))  MRSA PCR Screening     Status: None   Collection Time: 09/14/15  6:28 PM  Result Value Ref Range Status   MRSA by PCR NEGATIVE NEGATIVE Final    Comment:        The GeneXpert MRSA  Assay (FDA approved for NASAL specimens only), is one component of a comprehensive MRSA colonization surveillance program. It is not intended to diagnose MRSA infection nor to guide or monitor treatment for MRSA infections.   Culture, blood (routine x 2)     Status: None   Collection Time: 09/14/15 11:20 PM  Result Value Ref Range Status   Specimen Description BLOOD RIGHT ARM  Final   Special Requests BOTTLES DRAWN AEROBIC AND ANAEROBIC 10CC   Final   Culture NO GROWTH 5 DAYS  Final   Report Status 09/20/2015 FINAL  Final  Culture, blood (routine x 2)     Status: None   Collection Time: 09/14/15 11:30 PM  Result Value Ref Range Status   Specimen Description BLOOD LEFT ARM  Final   Special Requests BOTTLES DRAWN AEROBIC AND ANAEROBIC 10CC   Final   Culture NO GROWTH 5 DAYS  Final   Report Status 09/20/2015 FINAL  Final  Urine culture     Status: None   Collection Time: 09/15/15 12:24 AM  Result Value Ref Range Status    Specimen Description URINE, CLEAN CATCH  Final   Special Requests NONE  Final   Culture NO GROWTH 1 DAY  Final   Report Status 09/16/2015 FINAL  Final     Studies:  Recent x-ray studies have been reviewed in detail by the Attending Physician  Scheduled Meds:  Scheduled Meds: . antiseptic oral rinse  7 mL Mouth Rinse q12n4p  . aspirin  81 mg Oral Daily  . budesonide (PULMICORT) nebulizer solution  0.25 mg Nebulization BID  . chlorhexidine  15 mL Mouth Rinse BID  . dabigatran  150 mg Oral Q12H  . diltiazem  360 mg Oral Daily  . famotidine  20 mg Oral Daily  . furosemide  40 mg Oral Daily  . isosorbide mononitrate  60 mg Oral Daily  . levofloxacin  500 mg Oral Daily  . levothyroxine  50 mcg Oral QAC breakfast  . metoprolol succinate  150 mg Oral QHS  . metoprolol succinate  200 mg Oral Daily  . sodium chloride  3 mL Intravenous Q12H    Time spent on care of this patient: 40 mins   Lessly Stigler, Geraldo Docker , MD  [facility] Office  [phone] Pager [phone]  On-Call/Text Page:      [email or web address]      password [email or web address]  If 7PM-7AM, please contact night-coverage [email or web address] Password [email or web address] 09/21/2015, 8:20 AM   LOS: 7 days   Care during the described time interval was provided by me .  I have reviewed this patient's available data, including medical history, events of note, physical examination, and all test results as part of my evaluation. I have personally reviewed and interpreted all radiology studies.   Dia Crawford, MD [phone] Pager

## 2015-09-20 NOTE — Progress Notes (Signed)
PROGRESS NOTE  Subjective:   69 y/o female followed by [facility], with h/o CAD, chronic atrial fibrillation, CHF, tobacco abuse and COPD, admitted for acute hypoxic respiratory failure in the setting of PNA, acute COPD exacerbation and CHF. Hospital course further complicated by Afib w/ RVR.   We started PO dilt yesterday, Dilt drip is still going at 5 mg / hr  HR is much better    Objective:    Vital Signs:   Temp:  [97.5 F (36.4 C)-98.7 F (37.1 C)] 97.5 F (36.4 C) (10/20 0757) Pulse Rate:  [63-96] 63 (10/20 0757) Resp:  [16-24] 18 (10/20 0757) BP: (92-166)/(52-91) 143/83 mmHg (10/20 0757) SpO2:  [94 %-99 %] 96 % (10/20 0805) Weight:  [52.118 kg (114 lb 14.4 oz)] 52.118 kg (114 lb 14.4 oz) (10/20 0500)  Last BM Date: 09/19/15   24-hour weight change: Weight change: -1.225 kg (-2 lb 11.2 oz)  Weight trends: Filed Weights   09/18/15 0500 09/19/15 0346 09/20/15 0500  Weight: 55.7 kg (122 lb 12.7 oz) 53.343 kg (117 lb 9.6 oz) 52.118 kg (114 lb 14.4 oz)    Intake/Output:  10/19 0701 - 10/20 0700 In: 740 [P.O.:590; I.V.:150] Out: 1750 [Urine:1750]     Physical Exam: BP 143/83 mmHg  Pulse 63  Temp(Src) 97.5 F (36.4 C) (Oral)  Resp 18  Ht 5' (1.524 m)  Wt 52.118 kg (114 lb 14.4 oz)  BMI 22.44 kg/m2  SpO2 96%  LMP  (LMP Unknown)  Wt Readings from Last 3 Encounters:  09/20/15 52.118 kg (114 lb 14.4 oz)    General: Vital signs reviewed and noted.   Head: Normocephalic, atraumatic.  Eyes: conjunctivae/corneas clear.  EOM's intact.   Throat: normal  Neck:  normal   Lungs:    diffuse tight wheezing   Heart:  irreg. irreg   Abdomen:  Soft, non-tender, non-distended    Extremities: No edema    Neurologic: A&O X3, CN II - XII are grossly intact.   Psych: Normal     Labs: BMET:  Recent Labs  09/18/15 0222 09/20/15 0230  NA 139 140  K 3.5 3.9  CL 97* 100*  CO2 32 29  GLUCOSE 107* 112*  BUN 31* 28*  CREATININE 0.93 0.98  CALCIUM 8.9  9.2  MG 2.0 2.2    Liver function tests: No results for input(s): AST, ALT, ALKPHOS, BILITOT, PROT, ALBUMIN in the last 72 hours. No results for input(s): LIPASE, AMYLASE in the last 72 hours.  CBC:  Recent Labs  09/18/15 0222 09/20/15 0230  WBC 13.5* 18.5*  HGB 12.5 14.3  HCT 40.8 46.0  MCV 92.3 91.8  PLT 223 282    Cardiac Enzymes: No results for input(s): CKTOTAL, CKMB, TROPONINI in the last 72 hours.  Coagulation Studies: No results for input(s): LABPROT, INR in the last 72 hours.  Other: Invalid input(s): POCBNP No results for input(s): DDIMER in the last 72 hours. No results for input(s): HGBA1C in the last 72 hours. No results for input(s): CHOL, HDL, LDLCALC, TRIG, CHOLHDL in the last 72 hours. No results for input(s): TSH, T4TOTAL, T3FREE, THYROIDAB in the last 72 hours.  Invalid input(s): FREET3 No results for input(s): VITAMINB12, FOLATE, FERRITIN, TIBC, IRON, RETICCTPCT in the last 72 hours.   Other results:  Tele   ( personally reviewed ) atrial fib at 60-70 ,   Medications:    Infusions: . diltiazem (CARDIZEM) infusion 5 mg/hr (09/19/15 1806)    Scheduled Medications: .  antiseptic oral rinse  7 mL Mouth Rinse q12n4p  . aspirin  81 mg Oral Daily  . budesonide (PULMICORT) nebulizer solution  0.25 mg Nebulization BID  . chlorhexidine  15 mL Mouth Rinse BID  . dabigatran  150 mg Oral Q12H  . diltiazem  360 mg Oral Daily  . famotidine  20 mg Oral Daily  . furosemide  40 mg Oral Daily  . ipratropium  0.5 mg Nebulization QID  . isosorbide mononitrate  60 mg Oral Daily  . levalbuterol  0.63 mg Nebulization QID  . levofloxacin  500 mg Oral Daily  . levothyroxine  50 mcg Oral QAC breakfast  . metoprolol succinate  150 mg Oral QHS  . metoprolol succinate  200 mg Oral Daily  . sodium chloride  3 mL Intravenous Q12H    Assessment/ Plan:   Active Problems:   Acute on chronic respiratory failure (HCC)   COPD exacerbation (HCC)   HTN  (hypertension)   Hypothyroidism   CAD (coronary artery disease) of artery bypass graft   CHF (congestive heart failure) (HCC)   A-fib (HCC)   Esophageal cancer (HCC)   CKD (chronic kidney disease) stage 3, GFR 30-59 ml/min   Acute respiratory failure (HCC)   Atrial fibrillation with RVR (HCC)   Acute on chronic respiratory failure with hypoxia and hypercapnia (HCC)   Sepsis due to pneumonia (HCC)   Acute renal insufficiency   S/P CABG x 3   1. Chronic Atrial fib - on pradaxa HR had increased secondary to her COPD exacerbation  HR is now better on her home meds.  Will DC dilt drip. I suspect her HR will improve as her pneumonia improves.   Will sign off.  Call for question .  Follow up with her primary cardiologist   Length of Stay: 6  Thayer Headings, Brooke Bonito., MD, [facility] 09/20/2015, 8:17 AM Office [phone] Pager [phone]

## 2015-09-21 DIAGNOSIS — I2510 Atherosclerotic heart disease of native coronary artery without angina pectoris: Secondary | ICD-10-CM | POA: Diagnosis present

## 2015-09-21 DIAGNOSIS — I10 Essential (primary) hypertension: Secondary | ICD-10-CM | POA: Diagnosis present

## 2015-09-21 DIAGNOSIS — I272 Pulmonary hypertension, unspecified: Secondary | ICD-10-CM | POA: Diagnosis present

## 2015-09-21 DIAGNOSIS — E038 Other specified hypothyroidism: Secondary | ICD-10-CM

## 2015-09-21 DIAGNOSIS — I482 Chronic atrial fibrillation, unspecified: Secondary | ICD-10-CM | POA: Diagnosis present

## 2015-09-21 LAB — CBC WITH DIFFERENTIAL/PLATELET
Basophils Absolute: 0 10*3/uL (ref 0.0–0.1)
Basophils Relative: 0 %
EOS PCT: 1 %
Eosinophils Absolute: 0.2 10*3/uL (ref 0.0–0.7)
HCT: 47.8 % — ABNORMAL HIGH (ref 36.0–46.0)
Hemoglobin: 14.8 g/dL (ref 12.0–15.0)
LYMPHS ABS: 2.5 10*3/uL (ref 0.7–4.0)
Lymphocytes Relative: 16 %
MCH: 28.7 pg (ref 26.0–34.0)
MCHC: 31 g/dL (ref 30.0–36.0)
MCV: 92.8 fL (ref 78.0–100.0)
MONO ABS: 1.4 10*3/uL — AB (ref 0.1–1.0)
MONOS PCT: 9 %
NEUTROS ABS: 11.4 10*3/uL — AB (ref 1.7–7.7)
Neutrophils Relative %: 74 %
PLATELETS: 255 10*3/uL (ref 150–400)
RBC: 5.15 MIL/uL — AB (ref 3.87–5.11)
RDW: 17.3 % — AB (ref 11.5–15.5)
WBC: 15.5 10*3/uL — ABNORMAL HIGH (ref 4.0–10.5)

## 2015-09-21 LAB — COMPREHENSIVE METABOLIC PANEL
ALBUMIN: 2.9 g/dL — AB (ref 3.5–5.0)
ALT: 82 U/L — AB (ref 14–54)
AST: 42 U/L — AB (ref 15–41)
Alkaline Phosphatase: 71 U/L (ref 38–126)
Anion gap: 7 (ref 5–15)
BUN: 20 mg/dL (ref 6–20)
CHLORIDE: 102 mmol/L (ref 101–111)
CO2: 30 mmol/L (ref 22–32)
CREATININE: 0.89 mg/dL (ref 0.44–1.00)
Calcium: 8.9 mg/dL (ref 8.9–10.3)
GFR calc Af Amer: >60 mL/min (ref >60)
GLUCOSE: 82 mg/dL (ref 65–99)
POTASSIUM: 4.1 mmol/L (ref 3.5–5.1)
Sodium: 139 mmol/L (ref 135–145)
Total Bilirubin: 0.6 mg/dL (ref 0.3–1.2)
Total Protein: 5.5 g/dL — ABNORMAL LOW (ref 6.5–8.1)

## 2015-09-21 LAB — MAGNESIUM: Magnesium: 2 mg/dL (ref 1.7–2.4)

## 2015-09-21 MED ORDER — METOPROLOL SUCCINATE ER 200 MG PO TB24: 200.0000 mg | ORAL_TABLET | Freq: Every day | ORAL | Status: AC

## 2015-09-21 MED ORDER — DM-GUAIFENESIN ER 30-600 MG PO TB12: 1.0000 | ORAL_TABLET | Freq: Two times a day (BID) | ORAL | Status: AC | PRN

## 2015-09-21 MED ORDER — METOPROLOL SUCCINATE ER 50 MG PO TB24: 150.0000 mg | ORAL_TABLET | Freq: Every day | ORAL | Status: AC

## 2015-09-21 NOTE — Progress Notes (Signed)
Physical Therapy Treatment Patient Details Name: Lindsey Mejia MRN: [id number] DOB: March 05, 1946 Today's Date: 09/21/2015    History of Present Illness 69yo F with Hx of COPD on 2L oxygen at night, Afib with RVR on dabigitran, CAD s/p CABG in 1997 and PCI in 2013, PE in 2013, HTN, Hypothyroidism, and GERD who presented to [facility] with acute respiratory failure with O2 saturations in the 50s. She was started initially on NRB and then on Bipap. CXR showed apparent LLL infiltrate    PT Comments    PT treatment session focused on safety with ambulation and activity tolerance.  Pt progressing well with O2 sats >90% on room air with ambulation and no dyspnea.  Pt will benefit from further acute PT services to continue progressing safety and independence with functional mobility and to continue making activity tolerance improvements.  Follow Up Recommendations  No PT follow up;Supervision - Intermittent     Equipment Recommendations  None recommended by PT    Recommendations for Other Services       Precautions / Restrictions Precautions Precautions: Other (comment) Precaution Comments: watch HR Restrictions Weight Bearing Restrictions: No    Mobility  Bed Mobility               General bed mobility comments: pt received sitting up in chair  Transfers Overall transfer level: Modified independent Equipment used: None                Ambulation/Gait Ambulation/Gait assistance: Independent Ambulation Distance (Feet): 200 Feet Assistive device: None Gait Pattern/deviations: Step-through pattern;Decreased stride length Gait velocity: slower Gait velocity interpretation: Below normal speed for age/gender General Gait Details: Ambulates well without noted LOB, though no challenges to balance either.  No increased SOB with ambulation.  On RA at rest O2 sats 95% and with ambulation stayed >90%.   Stairs            Wheelchair Mobility    Modified Rankin  (Stroke Patients Only)       Balance Overall balance assessment: No apparent balance deficits (not formally assessed)                                  Cognition Arousal/Alertness: Awake/alert Behavior During Therapy: WFL for tasks assessed/performed Overall Cognitive Status: Within Functional Limits for tasks assessed                      Exercises      General Comments General comments (skin integrity, edema, etc.): VSS throughout session.      Pertinent Vitals/Pain Pain Assessment: No/denies pain    Home Living                      Prior Function            PT Goals (current goals can now be found in the care plan section) Acute Rehab PT Goals Patient Stated Goal: to go home PT Goal Formulation: With patient Time For Goal Achievement: 09/24/15 Potential to Achieve Goals: Good Progress towards PT goals: Progressing toward goals    Frequency  Min 3X/week    PT Plan Current plan remains appropriate    Co-evaluation             End of Session Equipment Utilized During Treatment: Gait belt Activity Tolerance: Patient tolerated treatment well Patient left: in chair;with call bell/phone within reach     Time:  7373-6681 PT Time Calculation (min) (ACUTE ONLY): 10 min  Charges:  $Gait Training: 8-22 mins                    G Codes:      Lindsey Mejia 2015-10-08, 12:56 PM  Lorita Officer, SPT

## 2015-09-21 NOTE — Progress Notes (Signed)
Notified by central telemetry that pt had 14 beats Vtach.  Pt sitting up in chair.  No complaints.  Vitals stable. Dr. Sherral Hammers notified.

## 2015-09-21 NOTE — Progress Notes (Signed)
PROGRESS NOTE  Subjective:   69 y/o female followed by [facility], with h/o CAD, chronic atrial fibrillation, CHF, tobacco abuse and COPD, admitted for acute hypoxic respiratory failure in the setting of PNA, acute COPD exacerbation and CHF. Hospital course further complicated by Afib w/ RVR.     HR is much better    Objective:    Vital Signs:   Temp:  [97.5 F (36.4 C)-98 F (36.7 C)] 97.5 F (36.4 C) (10/21 0731) Pulse Rate:  [30-84] 78 (10/21 0731) Resp:  [16-20] 18 (10/21 0731) BP: (109-157)/(57-89) 144/88 mmHg (10/21 0731) SpO2:  [86 %-100 %] 96 % (10/21 0821) Weight:  [114 lb 1.6 oz (51.755 kg)] 114 lb 1.6 oz (51.755 kg) (10/21 0500)  Last BM Date: 09/19/15   24-hour weight change: Weight change: -12.8 oz (-0.363 kg)  Weight trends: Filed Weights   09/19/15 0346 09/20/15 0500 09/21/15 0500  Weight: 117 lb 9.6 oz (53.343 kg) 114 lb 14.4 oz (52.118 kg) 114 lb 1.6 oz (51.755 kg)    Intake/Output:  10/20 0701 - 10/21 0700 In: 735 [P.O.:750; I.V.:5] Out: 2150 [Urine:2150]     Physical Exam: BP 144/88 mmHg  Pulse 78  Temp(Src) 97.5 F (36.4 C) (Oral)  Resp 18  Ht 5' (1.524 m)  Wt 114 lb 1.6 oz (51.755 kg)  BMI 22.28 kg/m2  SpO2 96%  LMP  (LMP Unknown)  Wt Readings from Last 3 Encounters:  09/21/15 114 lb 1.6 oz (51.755 kg)    General: Vital signs reviewed and noted.   Head: Normocephalic, atraumatic.  Eyes: conjunctivae/corneas clear.  EOM's intact.   Throat: normal  Neck:  normal   Lungs:    diffuse tight wheezing   Heart:  irreg. irreg   Abdomen:  Soft, non-tender, non-distended    Extremities: No edema    Neurologic: A&O X3, CN II - XII are grossly intact.   Psych: Normal     Labs: BMET:  Recent Labs  09/20/15 0230  NA 140  K 3.9  CL 100*  CO2 29  GLUCOSE 112*  BUN 28*  CREATININE 0.98  CALCIUM 9.2  MG 2.2    Liver function tests: No results for input(s): AST, ALT, ALKPHOS, BILITOT, PROT, ALBUMIN in the last 72  hours. No results for input(s): LIPASE, AMYLASE in the last 72 hours.  CBC:  Recent Labs  09/20/15 0230  WBC 18.5*  HGB 14.3  HCT 46.0  MCV 91.8  PLT 282    Cardiac Enzymes: No results for input(s): CKTOTAL, CKMB, TROPONINI in the last 72 hours.  Coagulation Studies: No results for input(s): LABPROT, INR in the last 72 hours.  Other: Invalid input(s): POCBNP No results for input(s): DDIMER in the last 72 hours. No results for input(s): HGBA1C in the last 72 hours. No results for input(s): CHOL, HDL, LDLCALC, TRIG, CHOLHDL in the last 72 hours. No results for input(s): TSH, T4TOTAL, T3FREE, THYROIDAB in the last 72 hours.  Invalid input(s): FREET3 No results for input(s): VITAMINB12, FOLATE, FERRITIN, TIBC, IRON, RETICCTPCT in the last 72 hours.   Other results:  Tele   ( personally reviewed ) atrial fib at 60-70 ,   Medications:    Infusions:    Scheduled Medications: . antiseptic oral rinse  7 mL Mouth Rinse q12n4p  . aspirin  81 mg Oral Daily  . budesonide (PULMICORT) nebulizer solution  0.25 mg Nebulization BID  . chlorhexidine  15 mL Mouth Rinse BID  . dabigatran  150 mg Oral Q12H  . diltiazem  360 mg Oral Daily  . famotidine  20 mg Oral Daily  . furosemide  40 mg Oral Daily  . isosorbide mononitrate  60 mg Oral Daily  . levofloxacin  500 mg Oral Daily  . levothyroxine  50 mcg Oral QAC breakfast  . metoprolol succinate  150 mg Oral QHS  . metoprolol succinate  200 mg Oral Daily  . sodium chloride  3 mL Intravenous Q12H    Assessment/ Plan:   Active Problems:   Acute on chronic respiratory failure (HCC)   COPD exacerbation (HCC)   HTN (hypertension)   Hypothyroidism   CAD (coronary artery disease) of artery bypass graft   CHF (congestive heart failure) (HCC)   A-fib (HCC)   Esophageal cancer (HCC)   CKD (chronic kidney disease) stage 3, GFR 30-59 ml/min   Acute respiratory failure (HCC)   Atrial fibrillation with RVR (HCC)   Acute on chronic  respiratory failure with hypoxia and hypercapnia (HCC)   Sepsis due to pneumonia (HCC)   Acute renal insufficiency   S/P CABG x 3   Chronic atrial fibrillation with RVR (HCC)   Pulmonary hypertension (HCC)   Essential hypertension   CAD in native artery   1. Chronic Atrial fib - on pradaxa HR had increased secondary to her COPD exacerbation  HR is now better on her home meds.  Continue coreg  Will sign off.  Call for question .  Follow up with her primary cardiologist   Length of Stay: 7  Thayer Headings, Brooke Bonito., MD, [facility] 09/21/2015, 8:31 AM Office [phone] Pager [phone]

## 2015-09-21 NOTE — Discharge Summary (Signed)
Physician Discharge Summary  Lindsey Mejia YCX:[id number] DOB: 01-Feb-1946 DOA: 09/14/2015  PCP: Azzie Almas, MD  Admit date: 09/14/2015 Discharge date: 09/21/2015  Time spent: 40 minutes  Recommendations for Outpatient Follow-up:  Chronic A-fib with acute RVR -Rate has been better controlled over last 24 hours, however patient has not exerted herself. - CHA2DS2-VASc 4+  - cont Pradaxa  - Continue Cardizem 360 mg daily  -Continue Imdur 60 mg daily -Continue metoprolol 150 mg QHS -Continue metoprolol 200 mg daily -Continue Lasix 40 mg daily  Acute on chronic hypoxic and hypercarbic respiratory failure - multifactorial (see below) -Continues O2 during at bedtime 2 L  COPD exacerbation/HCAP/sepsis -Has completed course of antibiotics   -Mucinex DM BID  OSA -Spoke with patient concerning need for official sleep study. States she will have Dr. Kellie Mejia (PCP) set up appointment at [facility] which is closer to her residence. 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 Weight yourself daily and record in journal  -Follow-up with Dr. Lendon Collar (cardiologist) in 2 weeks  Pulmonary hypertension -See chronic systolic CHF  Acute renal insufficiency - NO CKD -Cr at [facility] in August NORMAL at 0.63 - renal fxn has not yet returned to normal, but is stable - resumed lasix - follow   Hx of PE  Cont pradaxa 150 mg BID  HTN -See acute CHF   Hypothyroidism -TSH July 1.15 - cont home dose synthroid   CAD s/p CABG 3  vessel - very mildly elevated troponin Denies cp - trop not significantly elevated initially but then increased to 0.16  - recheck troponin improved  - Stress test 03/2014 SPECT at [facility]: "abnormal probably low risk study" -See chronic systolic CHF  Hx esophageal CA    Discharge Diagnoses:  Active Problems:   Acute on chronic respiratory failure (HCC)   COPD exacerbation (HCC)   HTN (hypertension)   Hypothyroidism   CAD (coronary artery disease) of artery bypass graft   CHF (congestive heart failure) (HCC)   A-fib (HCC)   Esophageal cancer (HCC)   CKD (chronic kidney disease) stage 3, GFR 30-59 ml/min   Acute respiratory failure (HCC)   Atrial fibrillation with RVR (HCC)   Acute on chronic respiratory failure with hypoxia and hypercapnia (HCC)   Sepsis due to pneumonia (HCC)   Acute renal insufficiency   S/P CABG x 3   Chronic atrial fibrillation with RVR ([place])   Pulmonary hypertension (HCC)   Essential hypertension   CAD in native artery   Discharge Condition: Stable  Diet recommendation: Heart healthy  Filed Weights   09/19/15 0346 09/20/15 0500 09/21/15 0500  Weight: 53.343 kg (117 lb 9.6 oz) 52.118 kg (114 lb 14.4 oz) 51.755 kg (114 lb 1.6 oz)    History of present illness:  [name] with PMHx of COPD on 2L oxygen at night, Afib with RVR on Dabigitran, CAD s/p CABG in 1997 and PCI in 2013, PE in 2013, HTN, Hypothyroidism, and GERD   Presented to [facility] with acute respiratory failure with O2 saturations in the  5s. She was started initially on NRB and then on Bipap. CXR showed apparent LLL infiltrate. She had a BNP of 11,500. She was initially given IV lasix and nitropatch, however, her blood pressure could not tolerate the nitropatch and this was discontinued. BC were ordered, sputum culture unable to be obtained. She was given Levaquin and Vancomycin in the ED along with solumedrol 125mg . Foley was placed. She was given metoprolol 5mg  IV for her  elevated HR.   ABG on BIPAP showed 7.34/63/119, Lactate was 3.5, trended down to 1.7. Antibiotics were chosen given recent admission to [facility] in August of 2016 (2 months). CBC was 19, Cr 1.2. TnI initially negative.  Previous hospitalization patient was treated for COPD exacerbation as well as acute on chronic systolic CHF. After several days of antibiotics and diuresis patient's respiratory status has improved significantly.    Procedure/Significant Events: 10/18 Echocardiogram; LVEF=40% to 45%. Diffuse hypokinesis. - Right ventricle: moderately dilated. - Right atrium: moderately dilated. - Tricuspid valve: moderate-severe regurgitation. - Pulmonary arteries: PApeak pressure: 43 mm Hg (S).   Culture   Antibiotics: Levaquin 10/14 > Vanc 10/14 > 10/16   Discharge Exam: Filed Vitals:   09/21/15 0731 09/21/15 0821 09/21/15 1132 09/21/15 1616  BP: 144/88  108/55 110/43  Pulse: 78     Temp: 97.5 F (36.4 C)  97.8 F (36.6 C) 97.8 F (36.6 C)  TempSrc: Oral  Oral Oral  Resp: 18  18 17   Height:      Weight:      SpO2: 97% 96% 96% 95%   General: A/O 4, negative acute  respiratory distress Eyes: Negative headache, eye pain, double vision,negative scleral hemorrhage ENT: Negative Runny nose, negative ear pain, negative gingival bleeding, Neck: Negative scars, masses, torticollis, lymphadenopathy, JVD Lungs: diffuse poor air movement (most likely baseline), positive diffuse rhonchi Lt>>Rt (significantly improved) , negative expiratory wheezing, negative crackles  Cardiovascular: Regular rate and rhythm without murmur gallop or rub normal S1 and S2   Discharge Instructions     Medication List    ASK your doctor about these medications        aspirin 81 MG chewable tablet  Chew 81 mg by mouth daily.     atorvastatin 40 MG tablet  Commonly known as:  LIPITOR  Take 40 mg by mouth daily.     beclomethasone 80 MCG/ACT inhaler  Commonly known as:  QVAR  Inhale 1  puff into the lungs 2 (two) times daily.     cetirizine 10 MG chewable tablet  Commonly known as:  ZYRTEC  Chew 10 mg by mouth daily.     dabigatran 150 MG Caps capsule  Commonly known as:  PRADAXA  Take 150 mg by mouth 2 (two) times daily.     diazepam 10 MG tablet  Commonly known as:  VALIUM  Take 10 mg by mouth every 6 (six) hours as needed for anxiety.     diltiazem 360 MG 24 hr capsule  Commonly known as:  TIAZAC  Take 360 mg by mouth daily.     famotidine 20 MG tablet  Commonly known as:  PEPCID  Take 20 mg by mouth daily.     furosemide 40 MG tablet  Commonly known as:  LASIX  Take 40 mg by mouth daily.     isosorbide mononitrate 60 MG 24 hr tablet  Commonly known as:  IMDUR  Take 60 mg by mouth daily.     levothyroxine 50 MCG tablet  Commonly known as:  SYNTHROID, LEVOTHROID  Take 50 mcg by mouth daily before breakfast.     magnesium oxide 400 MG tablet  Commonly known as:  MAG-OX  Take 400 mg by mouth daily.     metoprolol succinate 100 MG 24 hr tablet  Commonly known as:  TOPROL-XL  Take 100-200 mg by mouth 2 (two) times daily. Pt takes 200mg  in the morning and 100mg  in the evening.Take with or immediately following a meal.     nitroGLYCERIN 0.4 MG SL tablet  Commonly known as:  NITROSTAT  Place 0.4 mg under the tongue every 5 (five) minutes as needed for chest pain.     oxyCODONE-acetaminophen 10-325 MG tablet  Commonly known as:  PERCOCET  Take 1 tablet by mouth every 4 (four) hours as needed for pain.     tiotropium 18 MCG inhalation capsule  Commonly known as:  SPIRIVA  Place 18 mcg into inhaler and inhale daily.       Allergies  Allergen Reactions  . Propoxyphene Nausea And Vomiting       Follow-up Information    Schedule an appointment as soon as possible for a visit with Caryn Section Christian Mate, MD.   Specialty:  Family Medicine   Why:  Schedule follow-up with Dr. Kellie Mejia 7-10 days from today;Acute on chronic hypoxic and hypercarbic  respiratory failure, COPD exacerbation, A. fib with RVR   Contact information:   [place] [address] [phone]       Follow up with [facility].   Specialty:  Pulmonology   Why:  Establish care with [facility] pulmonology in 2 months; COPD, suspected OSA requires sleep study.   Contact information:   [place] [phone]      Follow up with Nahser, Wonda Cheng, MD. Schedule an appointment as soon as possible for a visit in 4 weeks.   Specialty:  Cardiology   Why:  Establish care with Dr.Philip J Nahser (CHF clinic) in 4-6 weeks, systolic CHF, pulmonary hypertension   Contact information:   [place] [address] [phone]        The results of significant diagnostics from this hospitalization (including imaging, microbiology, ancillary and laboratory) are listed below for reference.    Significant Diagnostic Studies: Dg Chest Port 1 View  09/16/2015  CLINICAL DATA:  Pneumonia. Esophageal cancer. Coronary artery disease with CHF. EXAM: PORTABLE CHEST 1 VIEW COMPARISON:  1 day prior FINDINGS: Prior median sternotomy. Midline trachea. Mild cardiomegaly. Atherosclerosis in the transverse aorta. Small layering right pleural effusion persists. No pneumothorax. Interstitial edema is asymmetric, greater on the right. Slightly increased. Increased right and developing left base airspace disease. IMPRESSION: Slight worsening aeration, with increased congestive heart failure and layering right pleural effusion. Progressive right worse than left bibasilar atelectasis versus concurrent infection. Electronically Signed   By: Abigail Miyamoto M.D.   On: 09/16/2015 08:07   Dg Chest Port 1 View  09/15/2015  CLINICAL DATA:  Acute respiratory failure. EXAM: PORTABLE CHEST 1 VIEW COMPARISON:  None. FINDINGS: Prior median sternotomy. Midline trachea. Cardiomegaly accentuated by AP portable technique. Right  pleural effusion is small. No pneumothorax. Mild interstitial edema. Right base airspace disease. IMPRESSION: Mild congestive heart failure. Small right pleural effusion with adjacent atelectasis versus infection. Atherosclerosis Electronically Signed   By: Abigail Miyamoto M.D.   On: 09/15/2015 09:35    Microbiology: Recent Results (from the past 240 hour(s))  MRSA PCR Screening  Status: None   Collection Time: 09/14/15  6:28 PM  Result Value Ref Range Status   MRSA by PCR NEGATIVE NEGATIVE Final    Comment:        The GeneXpert MRSA Assay (FDA approved for NASAL specimens only), is one component of a comprehensive MRSA colonization surveillance program. It is not intended to diagnose MRSA infection nor to guide or monitor treatment for MRSA infections.   Culture, blood (routine x 2)     Status: None   Collection Time: 09/14/15 11:20 PM  Result Value Ref Range Status   Specimen Description BLOOD RIGHT ARM  Final   Special Requests BOTTLES DRAWN AEROBIC AND ANAEROBIC 10CC   Final   Culture NO GROWTH 5 DAYS  Final   Report Status 09/20/2015 FINAL  Final  Culture, blood (routine x 2)     Status: None   Collection Time: 09/14/15 11:30 PM  Result Value Ref Range Status   Specimen Description BLOOD LEFT ARM  Final   Special Requests BOTTLES DRAWN AEROBIC AND ANAEROBIC 10CC   Final   Culture NO GROWTH 5 DAYS  Final   Report Status 09/20/2015 FINAL  Final  Urine culture     Status: None   Collection Time: 09/15/15 12:24 AM  Result Value Ref Range Status   Specimen Description URINE, CLEAN CATCH  Final   Special Requests NONE  Final   Culture NO GROWTH 1 DAY  Final   Report Status 09/16/2015 FINAL  Final     Labs: Basic Metabolic Panel:  Recent Labs Lab 09/16/15 0240 09/17/15 0243 09/18/15 0222 09/20/15 0230 09/21/15 0850  NA 134* 136 139 140 139  K 3.9 4.5 3.5 3.9 4.1  CL 90* 92* 97* 100* 102  CO2 34* 35* 32 29 30  GLUCOSE 168* 146* 107* 112* 82  BUN 31* 26* 31* 28*  20  CREATININE 1.07* 1.06* 0.93 0.98 0.89  CALCIUM 8.7* 8.8* 8.9 9.2 8.9  MG  --   --  2.0 2.2 2.0   Liver Function Tests:  Recent Labs Lab 09/15/15 0345 09/16/15 0240 09/21/15 0850  AST 49* 31 42*  ALT 92* 66* 82*  ALKPHOS 64 63 71  BILITOT 1.2 0.4 0.6  PROT 6.0* 5.7* 5.5*  ALBUMIN 3.0* 2.9* 2.9*   No results for input(s): LIPASE, AMYLASE in the last 168 hours. No results for input(s): AMMONIA in the last 168 hours. CBC:  Recent Labs Lab 09/16/15 0240 09/17/15 0243 09/18/15 0222 09/20/15 0230 09/21/15 0850  WBC 13.3* 16.5* 13.5* 18.5* 15.5*  NEUTROABS  --   --   --   --  11.4*  HGB 12.0 12.6 12.5 14.3 14.8  HCT 39.2 41.3 40.8 46.0 47.8*  MCV 92.0 92.6 92.3 91.8 92.8  PLT 193 220 223 282 255   Cardiac Enzymes:  Recent Labs Lab 09/14/15 2256 09/15/15 0345 09/15/15 0955 09/16/15 1054 09/17/15 0233  TROPONINI 0.04* 0.04* 0.16* 0.05* 0.07*   BNP: BNP (last 3 results)  Recent Labs  09/15/15 0345  BNP 694.0*    ProBNP (last 3 results) No results for input(s): PROBNP in the last 8760 hours.  CBG: No results for input(s): GLUCAP in the last 168 hours.     Signed:  Dia Crawford, MD [facility] [phone] pager

## 2016-03-01 DEATH — deceased

## 2016-12-09 IMAGING — CR DG CHEST 1V PORT
1 series · 1 of 1 positions shown · non-contrast
Comparison: None.

CLINICAL DATA: Acute respiratory failure.

EXAM:
PORTABLE CHEST 1 VIEW

[AP]
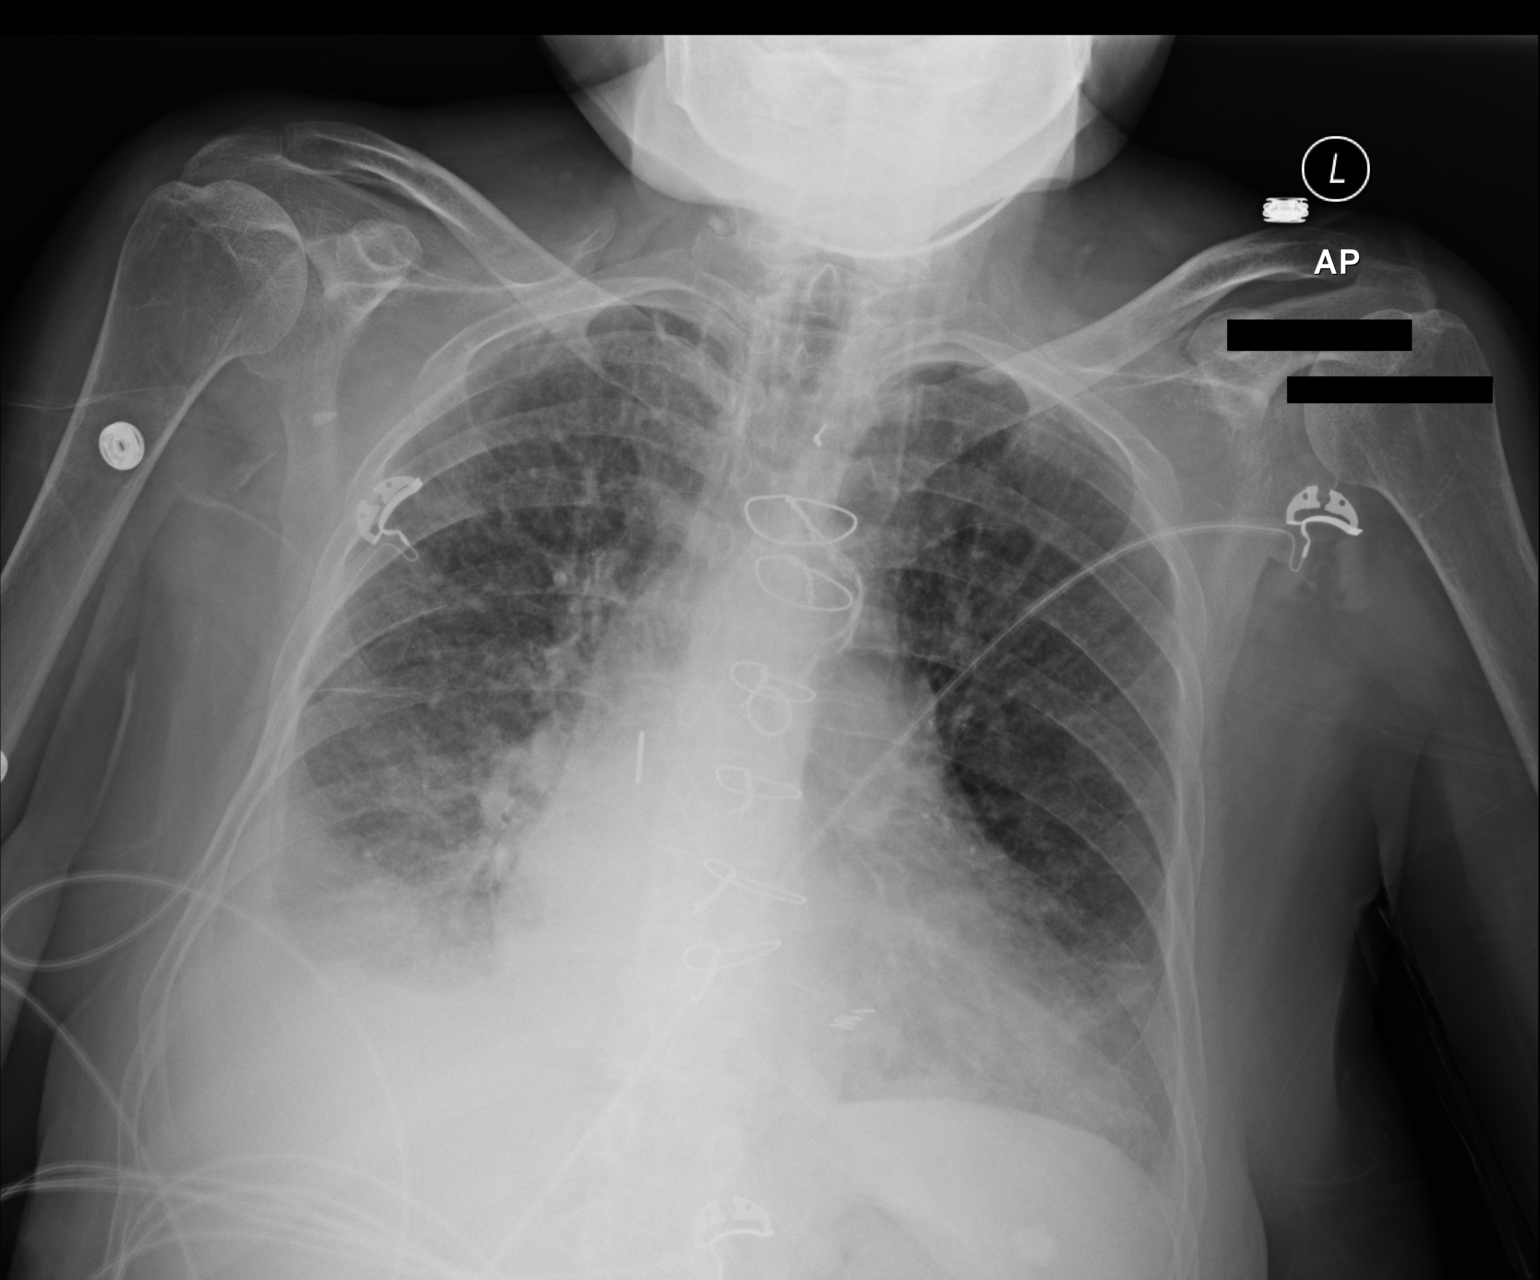

[1 of 1 positions shown; findings below may reference images not displayed]

FINDINGS: Prior median sternotomy. Midline trachea. Cardiomegaly accentuated
by AP portable technique. Right pleural effusion is small. No
pneumothorax. Mild interstitial edema. Right base airspace disease.
IMPRESSION: Mild congestive heart failure.

Small right pleural effusion with adjacent atelectasis versus
infection.

Atherosclerosis
